# Patient Record
Sex: Male | Born: 1955 | Race: White | Hispanic: No | Marital: Married | Smoking: Never smoker
Health system: Southern US, Community
[De-identification: ages and names within clinical notes are randomized; demographics above are authoritative.]

## PROBLEM LIST (undated history)

## (undated) ENCOUNTER — Emergency Department (HOSPITAL_COMMUNITY)

## (undated) HISTORY — PX: TONSILLECTOMY: SUR1361

---

## 2007-10-17 ENCOUNTER — Emergency Department (HOSPITAL_COMMUNITY): Admission: EM | Admit: 2007-10-17 | Discharge: 2007-10-18 | Payer: Self-pay | Admitting: Emergency Medicine

## 2011-10-09 LAB — COMPREHENSIVE METABOLIC PANEL
ALT: 34
AST: 36
Albumin: 4.1
BUN: 14
CO2: 21
Calcium: 10.3
GFR calc Af Amer: >60
Potassium: 3.6
Sodium: 144
Total Protein: 6.8

## 2011-10-09 LAB — DIFFERENTIAL
Eosinophils Relative: 0
Lymphs Abs: 1.7
Neutro Abs: 18.5 — ABNORMAL HIGH
Neutrophils Relative %: 85 — ABNORMAL HIGH

## 2011-10-09 LAB — URINALYSIS, ROUTINE W REFLEX MICROSCOPIC
Glucose, UA: 100 — AB
Ketones, ur: 40 — AB
Protein, ur: NEGATIVE

## 2011-10-09 LAB — CBC
HCT: 48.6
MCHC: 34.6
MCV: 85
Platelets: 307
RBC: 5.72
WBC: 21.9 — ABNORMAL HIGH

## 2020-07-02 ENCOUNTER — Inpatient Hospital Stay (HOSPITAL_COMMUNITY)
Admission: EM | Admit: 2020-07-02 | Discharge: 2020-07-05 | DRG: 690 | Disposition: A | Discharge status: Home / Self Care | Payer: Medicare Other | Attending: Internal Medicine | Admitting: Internal Medicine

## 2020-07-02 ENCOUNTER — Other Ambulatory Visit: Payer: Self-pay

## 2020-07-02 ENCOUNTER — Encounter (HOSPITAL_COMMUNITY): Payer: Self-pay | Admitting: *Deleted

## 2020-07-02 ENCOUNTER — Emergency Department (HOSPITAL_COMMUNITY): Payer: Medicare Other

## 2020-07-02 DIAGNOSIS — R739 Hyperglycemia, unspecified: Secondary | ICD-10-CM | POA: Diagnosis present

## 2020-07-02 DIAGNOSIS — Z23 Encounter for immunization: Secondary | ICD-10-CM

## 2020-07-02 DIAGNOSIS — N200 Calculus of kidney: Secondary | ICD-10-CM | POA: Diagnosis present

## 2020-07-02 DIAGNOSIS — I160 Hypertensive urgency: Secondary | ICD-10-CM | POA: Diagnosis present

## 2020-07-02 DIAGNOSIS — E663 Overweight: Secondary | ICD-10-CM | POA: Diagnosis present

## 2020-07-02 DIAGNOSIS — Z20822 Contact with and (suspected) exposure to covid-19: Secondary | ICD-10-CM | POA: Diagnosis present

## 2020-07-02 DIAGNOSIS — N151 Renal and perinephric abscess: Secondary | ICD-10-CM | POA: Diagnosis not present

## 2020-07-02 DIAGNOSIS — R197 Diarrhea, unspecified: Secondary | ICD-10-CM | POA: Diagnosis present

## 2020-07-02 DIAGNOSIS — N179 Acute kidney failure, unspecified: Secondary | ICD-10-CM

## 2020-07-02 DIAGNOSIS — R7303 Prediabetes: Secondary | ICD-10-CM | POA: Diagnosis present

## 2020-07-02 DIAGNOSIS — N10 Acute tubulo-interstitial nephritis: Secondary | ICD-10-CM | POA: Diagnosis present

## 2020-07-02 DIAGNOSIS — N289 Disorder of kidney and ureter, unspecified: Secondary | ICD-10-CM

## 2020-07-02 DIAGNOSIS — I10 Essential (primary) hypertension: Secondary | ICD-10-CM | POA: Diagnosis present

## 2020-07-02 DIAGNOSIS — Z6827 Body mass index (BMI) 27.0-27.9, adult: Secondary | ICD-10-CM | POA: Diagnosis not present

## 2020-07-02 DIAGNOSIS — N12 Tubulo-interstitial nephritis, not specified as acute or chronic: Secondary | ICD-10-CM

## 2020-07-02 LAB — URINALYSIS, ROUTINE W REFLEX MICROSCOPIC
Bacteria, UA: NONE SEEN
Bilirubin Urine: NEGATIVE
Glucose, UA: 150 mg/dL — AB
Ketones, ur: 5 mg/dL — AB
Leukocytes,Ua: NEGATIVE
Nitrite: NEGATIVE
Protein, ur: 30 mg/dL — AB
Specific Gravity, Urine: 1.029 (ref 1.005–1.030)
pH: 5 (ref 5.0–8.0)

## 2020-07-02 LAB — COMPREHENSIVE METABOLIC PANEL
ALT: 22 U/L (ref 0–44)
AST: 31 U/L (ref 15–41)
Albumin: 5.1 g/dL — ABNORMAL HIGH (ref 3.5–5.0)
Alkaline Phosphatase: 63 U/L (ref 38–126)
Anion gap: 15 (ref 5–15)
BUN: 21 mg/dL (ref 8–23)
CO2: 20 mmol/L — ABNORMAL LOW (ref 22–32)
Calcium: 10.1 mg/dL (ref 8.9–10.3)
Chloride: 107 mmol/L (ref 98–111)
Creatinine, Ser: 1.68 mg/dL — ABNORMAL HIGH (ref 0.61–1.24)
GFR calc Af Amer: 49 mL/min — ABNORMAL LOW (ref >60)
GFR calc non Af Amer: 42 mL/min — ABNORMAL LOW (ref >60)
Glucose, Bld: 207 mg/dL — ABNORMAL HIGH (ref 70–99)
Potassium: 3.8 mmol/L (ref 3.5–5.1)
Sodium: 142 mmol/L (ref 135–145)
Total Bilirubin: 1.2 mg/dL (ref 0.3–1.2)
Total Protein: 8.2 g/dL — ABNORMAL HIGH (ref 6.5–8.1)

## 2020-07-02 LAB — CBC
HCT: 49.5 % (ref 39.0–52.0)
Hemoglobin: 16.3 g/dL (ref 13.0–17.0)
MCH: 29.6 pg (ref 26.0–34.0)
MCHC: 32.9 g/dL (ref 30.0–36.0)
MCV: 90 fL (ref 80.0–100.0)
Platelets: 241 10*3/uL (ref 150–400)
RBC: 5.5 MIL/uL (ref 4.22–5.81)
RDW: 13.7 % (ref 11.5–15.5)
WBC: 22.2 10*3/uL — ABNORMAL HIGH (ref 4.0–10.5)
nRBC: 0 % (ref 0.0–0.2)

## 2020-07-02 LAB — SARS CORONAVIRUS 2 BY RT PCR (HOSPITAL ORDER, PERFORMED IN ~~LOC~~ HOSPITAL LAB): SARS Coronavirus 2: NEGATIVE

## 2020-07-02 LAB — LIPASE, BLOOD: Lipase: 24 U/L (ref 11–51)

## 2020-07-02 MED ORDER — MELATONIN 5 MG PO TABS
5.0000 mg | ORAL_TABLET | Freq: Every day | ORAL | Status: DC
  Administered 2020-07-02 – 2020-07-04 (×3): 5 mg via ORAL
  Filled 2020-07-02 (×3): qty 1

## 2020-07-02 MED ORDER — HYDROCODONE-ACETAMINOPHEN 5-325 MG PO TABS: 1.0000 | ORAL_TABLET | ORAL | Status: DC | PRN

## 2020-07-02 MED ORDER — SODIUM CHLORIDE 0.9 % IV SOLN: INTRAVENOUS | Status: AC

## 2020-07-02 MED ORDER — ENOXAPARIN SODIUM 40 MG/0.4ML ~~LOC~~ SOLN
40.0000 mg | Freq: Every day | SUBCUTANEOUS | Status: DC
  Administered 2020-07-02 – 2020-07-04 (×3): 40 mg via SUBCUTANEOUS
  Filled 2020-07-02 (×3): qty 0.4

## 2020-07-02 MED ORDER — ONDANSETRON HCL 4 MG/2ML IJ SOLN
4.0000 mg | Freq: Four times a day (QID) | INTRAMUSCULAR | Status: DC | PRN
  Administered 2020-07-03: 4 mg via INTRAVENOUS
  Filled 2020-07-02: qty 2

## 2020-07-02 MED ORDER — ACETAMINOPHEN 650 MG RE SUPP: 650.0000 mg | Freq: Four times a day (QID) | RECTAL | Status: DC | PRN

## 2020-07-02 MED ORDER — FENTANYL CITRATE (PF) 100 MCG/2ML IJ SOLN
50.0000 ug | Freq: Once | INTRAMUSCULAR | Status: AC
  Administered 2020-07-02: 50 ug via INTRAVENOUS
  Filled 2020-07-02: qty 2

## 2020-07-02 MED ORDER — ACETAMINOPHEN 325 MG PO TABS
650.0000 mg | ORAL_TABLET | Freq: Four times a day (QID) | ORAL | Status: DC | PRN
  Administered 2020-07-03: 650 mg via ORAL
  Filled 2020-07-02: qty 2

## 2020-07-02 MED ORDER — METOCLOPRAMIDE HCL 5 MG/ML IJ SOLN
5.0000 mg | Freq: Once | INTRAMUSCULAR | Status: AC
  Administered 2020-07-02: 5 mg via INTRAVENOUS
  Filled 2020-07-02: qty 2

## 2020-07-02 MED ORDER — ONDANSETRON HCL 4 MG PO TABS
4.0000 mg | ORAL_TABLET | Freq: Four times a day (QID) | ORAL | Status: DC | PRN
  Administered 2020-07-02: 4 mg via ORAL
  Filled 2020-07-02: qty 1

## 2020-07-02 MED ORDER — ONDANSETRON 4 MG PO TBDP
4.0000 mg | ORAL_TABLET | Freq: Once | ORAL | Status: AC | PRN
  Administered 2020-07-02: 4 mg via ORAL
  Filled 2020-07-02: qty 1

## 2020-07-02 MED ORDER — BISACODYL 5 MG PO TBEC: 5.0000 mg | DELAYED_RELEASE_TABLET | Freq: Every day | ORAL | Status: DC | PRN

## 2020-07-02 MED ORDER — PIPERACILLIN-TAZOBACTAM 3.375 G IVPB 30 MIN
3.3750 g | Freq: Once | INTRAVENOUS | Status: AC
  Administered 2020-07-02: 3.375 g via INTRAVENOUS
  Filled 2020-07-02: qty 50

## 2020-07-02 MED ORDER — SODIUM CHLORIDE 0.9 % IV BOLUS
1000.0000 mL | Freq: Once | INTRAVENOUS | Status: AC
Start: 2020-07-02 — End: 2020-07-02
  Administered 2020-07-02: 1000 mL via INTRAVENOUS

## 2020-07-02 MED ORDER — INSULIN ASPART 100 UNIT/ML ~~LOC~~ SOLN: 0.0000 [IU] | Freq: Three times a day (TID) | SUBCUTANEOUS | Status: DC

## 2020-07-02 MED ORDER — SODIUM CHLORIDE 0.9% FLUSH
3.0000 mL | Freq: Once | INTRAVENOUS | Status: AC
  Administered 2020-07-02: 3 mL via INTRAVENOUS

## 2020-07-02 MED ORDER — IOHEXOL 300 MG/ML  SOLN
80.0000 mL | Freq: Once | INTRAMUSCULAR | Status: AC | PRN
  Administered 2020-07-02: 75 mL via INTRAVENOUS

## 2020-07-02 MED ORDER — PNEUMOCOCCAL VAC POLYVALENT 25 MCG/0.5ML IJ INJ
0.5000 mL | INJECTION | INTRAMUSCULAR | Status: AC
  Administered 2020-07-03: 0.5 mL via INTRAMUSCULAR
  Filled 2020-07-02: qty 0.5

## 2020-07-02 MED ORDER — HYDROMORPHONE HCL 1 MG/ML IJ SOLN
0.5000 mg | INTRAMUSCULAR | Status: DC | PRN
  Administered 2020-07-03: 1 mg via INTRAVENOUS
  Filled 2020-07-02: qty 1

## 2020-07-02 MED ORDER — SENNOSIDES-DOCUSATE SODIUM 8.6-50 MG PO TABS: 1.0000 | ORAL_TABLET | Freq: Every evening | ORAL | Status: DC | PRN

## 2020-07-02 MED ORDER — SODIUM CHLORIDE (PF) 0.9 % IJ SOLN
INTRAMUSCULAR | Status: AC
  Administered 2020-07-02: 3 mL via INTRAVENOUS
  Filled 2020-07-02: qty 50

## 2020-07-02 NOTE — H&P (Signed)
History and Physical    Eamon Tantillo JQD:[id number] DOB: [birth year] DOA: 07/02/2020  PCP: Patient, No Pcp Per   Patient coming from: Home   Chief Complaint: Abdominal pain, N/V   HPI: Tarvis Blossom is a 64 y.o. male who denies any significant past medical history though admits to rarely seen a physician, now presenting to the emergency department with several hours of abdominal pain, nausea, and nonbloody vomiting.  Patient reports that he woke in his usual state of health and was going about his usual activities this morning when he developed pain in his abdomen and nausea.  Pain was mainly periumbilical with radiation around towards the right flank.  Symptoms were persistent and growing more severe, prompting his presentation to the ED.  He denies any cough or shortness of breath, has not felt fevers or chills, and has not noticed any hematuria.  He has never experienced this previously.  ED Course: Upon arrival to the ED, patient is found to be afebrile, saturating well on room air, and with stable blood pressure.  Chemistry panel is notable for glucose of 202 and creatinine 1.68.  CBC features a leukocytosis to 22,200.  CT of the abdomen and pelvis is concerning for acute left pyelonephritis with 2 cm exophytic enhancing mass most suggestive of abscess.  PA in the ED discussed the case with urology who recommended a trial of antibiotics only.  Patient was given IV fluids, antiemetics, analgesia, and empiric antibiotics.  COVID-19 screening test not yet resulted.  Review of Systems:  All other systems reviewed and apart from HPI, are negative.  History reviewed. No pertinent past medical history.  Past Surgical History:  Procedure Laterality Date  . TONSILLECTOMY       reports that he has never smoked. He has never used smokeless tobacco. He reports that he does not drink alcohol and does not use drugs.  No Known Allergies  History reviewed. No pertinent family history.   Prior to  Admission medications   Medication Sig Start Date End Date Taking? Authorizing Provider  melatonin 5 MG TABS Take 5 mg by mouth at bedtime.   Yes [provider]    Physical Exam: Vitals:   07/02/20 1751 07/02/20 1754 07/02/20 1935 07/02/20 2030  BP: (!) 198/99  130/60 (!) 147/77  Pulse: 65  (!) 59 86  Resp: 19  16 15   Temp: 98.2 F (36.8 C)     TempSrc: Oral     SpO2: 100%  99% 99%  Weight:  86.2 kg    Height:  5\' 10"  (1.778 m)      Constitutional: NAD, calm  Eyes: PERTLA, lids and conjunctivae normal ENMT: Mucous membranes are moist. Posterior pharynx clear of any exudate or lesions.   Neck: normal, supple, no masses, no thyromegaly Respiratory: no wheezing, no crackles. No accessory muscle use.  Cardiovascular: S1 & S2 heard, regular rate and rhythm. No extremity edema.   Abdomen: No distension, soft, mild tenderness without rebound pain or guarding. Bowel sounds active.  Musculoskeletal: no clubbing / cyanosis. No joint deformity upper and lower extremities.   Skin: no significant rashes, lesions, ulcers. Warm, dry, well-perfused. Neurologic: CN 2-12 grossly intact. Sensation intact. Moving all extremities.  Psychiatric: Alert and oriented to person, place, and situation. Very pleasant and cooperative.    Labs and Imaging on Admission: I have personally reviewed following labs and imaging studies  CBC: Recent Labs  Lab 07/02/20 1835  WBC 22.2*  HGB 16.3  HCT 49.5  MCV  90.0  PLT 241   Basic Metabolic Panel: Recent Labs  Lab 07/02/20 1835  NA 142  K 3.8  CL 107  CO2 20*  GLUCOSE 207*  BUN 21  CREATININE 1.68*  CALCIUM 10.1   GFR: Estimated Creatinine Clearance: 45.3 mL/min (A) (by C-G formula based on SCr of 1.68 mg/dL (H)). Liver Function Tests: Recent Labs  Lab 07/02/20 1835  AST 31  ALT 22  ALKPHOS 63  BILITOT 1.2  PROT 8.2*  ALBUMIN 5.1*   Recent Labs  Lab 07/02/20 1835  LIPASE 24   No results for input(s): AMMONIA in the last  168 hours. Coagulation Profile: No results for input(s): INR, PROTIME in the last 168 hours. Cardiac Enzymes: No results for input(s): CKTOTAL, CKMB, CKMBINDEX, TROPONINI in the last 168 hours. BNP (last 3 results) No results for input(s): PROBNP in the last 8760 hours. HbA1C: No results for input(s): HGBA1C in the last 72 hours. CBG: No results for input(s): GLUCAP in the last 168 hours. Lipid Profile: No results for input(s): CHOL, HDL, LDLCALC, TRIG, CHOLHDL, LDLDIRECT in the last 72 hours. Thyroid Function Tests: No results for input(s): TSH, T4TOTAL, FREET4, T3FREE, THYROIDAB in the last 72 hours. Anemia Panel: No results for input(s): VITAMINB12, FOLATE, FERRITIN, TIBC, IRON, RETICCTPCT in the last 72 hours. Urine analysis:    Component Value Date/Time   COLORURINE YELLOW 10/17/2007 2344   APPEARANCEUR CLEAR 10/17/2007 2344   LABSPEC 1.023 10/17/2007 2344   PHURINE 6.0 10/17/2007 2344   GLUCOSEU 100 (A) 10/17/2007 2344   HGBUR NEGATIVE 10/17/2007 2344   BILIRUBINUR NEGATIVE 10/17/2007 2344   KETONESUR 40 (A) 10/17/2007 2344   PROTEINUR NEGATIVE 10/17/2007 2344   UROBILINOGEN 0.2 10/17/2007 2344   NITRITE NEGATIVE 10/17/2007 2344   LEUKOCYTESUR  10/17/2007 2344    NEGATIVE MICROSCOPIC NOT DONE ON URINES WITH NEGATIVE PROTEIN, BLOOD, LEUKOCYTES, NITRITE, OR GLUCOSE <1000 mg/dL.   Sepsis Labs: @LABRCNTIP (procalcitonin:4,lacticidven:4) )No results found for this or any previous visit (from the past 240 hour(s)).   Radiological Exams on Admission: CT ABDOMEN PELVIS W CONTRAST  Result Date: 07/02/2020 CLINICAL DATA:  Abdominal abscess/infection suspected. Umbilical pain with vomiting this morning. EXAM: CT ABDOMEN AND PELVIS WITH CONTRAST TECHNIQUE: Multidetector CT imaging of the abdomen and pelvis was performed using the standard protocol following bolus administration of intravenous contrast. CONTRAST:  86mL OMNIPAQUE IOHEXOL 300 MG/ML  SOLN COMPARISON:  None. FINDINGS:  Lower chest: No acute abnormality. Hepatobiliary: No focal liver abnormality is seen. No radiopaque gallstones, biliary dilatation, or pericholecystic inflammatory changes. Pancreas: Unremarkable. No pancreatic ductal dilatation or surrounding inflammatory changes. Spleen: Normal in size without focal abnormality. Adrenals/Urinary Tract: Adrenal glands are normal in appearance. There is significant stranding surrounding the entire LEFT kidney. There is delay in the LEFT pyelogram on delayed images. No intrarenal or ureteral stones. There is a heterogeneous exophytic enhancing mass extending from the posterior aspect of the midpole region which measures 2.0 centimeters. No evidence for LEFT renal vein thrombosis. There is stranding surrounding the entire LEFT ureter. Within the renal pelvis of the RIGHT kidney, there are 2 calculi, measuring 9 and 5 millimeters. Small cyst is identified in the LOWER pole of the RIGHT kidney. No suspicious renal mass. RIGHT ureter is unremarkable. The bladder and visualized portion of the urethra are normal. Stomach/Bowel: Small sliding-type hiatal hernia. Stomach is otherwise normal in appearance. Small bionics are normal in caliber and wall thickness. Numerous colonic diverticula are present, but there is no evidence for acute diverticulitis. The appendix  is well seen and has a normal appearance. Vascular/Lymphatic: There is atherosclerotic calcification of the abdominal aorta, not associated with aneurysm. No retroperitoneal or mesenteric adenopathy. Reproductive: Prostate is unremarkable. Other: No ascites.  Anterior abdominal wall is unremarkable. Musculoskeletal: There are degenerative changes in the lumbar spine, notably at L3-4 where there is uncovertebral spurring and 4 millimeters retrolisthesis of L3 on L4. Facet hypertrophy noted in the LOWER lumbar levels. No lytic or blastic lesions. IMPRESSION: 1. Acute LEFT pyelonephritis. 2 centimeter heterogeneous exophytic enhancing  mass extending from the posterior aspect of the midpole region of the LEFT kidney likely represents abscess. As renal cell carcinoma cannot be entirely excluded, follow-up is recommended. 2. Nonobstructing RIGHT intrarenal calculi. 3. Normal appendix. 4. Colonic diverticulosis without acute diverticulitis. 5. Small sliding-type hiatal hernia. 6. Degenerative changes in the lumbar spine. 7. Aortic Atherosclerosis (ICD10-I70.0). Electronically Signed   By: Norva Pavlov M.D.   On: 07/02/2020 20:29    Assessment/Plan   1. Acute left pyelonephritis with abscess  - Presents with several hours of abdominal pain and N/V and is found to have marked leukocytosis and CT with stranding about the entire left kidney and ureter with 2 cm enhancing mass that is suspected to reflect an abscess but with RCC not entirely excluded  - ED discussed with urology who recommended trial of antibiotic therapy without drainage  - Check UA, culture blood and urine, treat with empiric cefepime, consult with urology and/or re-image if worsens or fails to improve with antibiotics alone; if he responds well to antibiotics, will still need follow-up given inability to completely excluded RCC    2. Renal insufficiency  - SCr is 1.68 in ED, up from 1.11 in 2008  - Could be acute given his presentation  - Renally-dose medications, continue IVF hydration, repeat chem panel in am    3. Hyperglycemia  - Serum glucose is 202 in ED, was 222 in 2008  - Patient denies hx of DM but has rarely seen a doctor  - Check A1c, check CBGs, use a low-intensity SSI if needed     DVT prophylaxis: Lovenox Code Status: Full  Family Communication: Wife updated at bedside Disposition Plan:  Patient is from: Home  Anticipated d/c is to: Home  Anticipated d/c date is: 07/05/20 Patient currently: has acute pyelonephritis with abscess  Consults called: None  Admission status: Inpatient     Briscoe Deutscher, MD [facility] Pager: See  [email or web address]  If 7AM-7PM, please contact the daytime attending [email or web address]  07/02/2020, 10:22 PM

## 2020-07-02 NOTE — ED Triage Notes (Signed)
Umbilical pain with vomiting since this am, unable to keep anything down.

## 2020-07-02 NOTE — ED Provider Notes (Addendum)
[facility]-EMERGENCY DEPT Provider Note   CSN: [id number] Arrival date & time: 07/02/20  1737     History Chief Complaint  Patient presents with  . Abdominal Pain    Johnny Hawkins is a 64 y.o. male with no significant past medical history.  He presents emergency department with severe abdominal pain.  Patient states that he was in his normal state of health when he awoke this morning and was doing his chores.  He used the bathroom and after words began having pain around his umbilicus.  Since that time he has had progressively worsening and now severe pain that radiates toward the right.  He has had multiple episodes of vomiting and diarrhea.  He denies fever or chills.  Patient does not take any medications regularly for any medical conditions.  He states he also does not see doctors regularly.  He drinks on occasion but does not drink daily, he also denies cigarette use, drug abuse.  HPI     History reviewed. No pertinent past medical history.  There are no problems to display for this patient.   Past Surgical History:  Procedure Laterality Date  . TONSILLECTOMY         No family history on file.  Social History   Tobacco Use  . Smoking status: Never Smoker  . Smokeless tobacco: Never Used  Substance Use Topics  . Alcohol use: Never  . Drug use: Never    Home Medications Prior to Admission medications   Not on File    Allergies    Patient has no known allergies.  Review of Systems   Review of Systems Ten systems reviewed and are negative for acute change, except as noted in the HPI.   Physical Exam Updated Vital Signs BP (!) 198/99 (BP Location: Left Arm)   Pulse 65   Temp 98.2 F (36.8 C) (Oral)   Resp 19   Ht 5\' 10"  (1.778 m)   Wt 86.2 kg   SpO2 100%   BMI 27.26 kg/m   Physical Exam Vitals and nursing note reviewed.  Constitutional:      General: He is not in acute distress.    Appearance: He is well-developed. He is not  diaphoretic.  HENT:     Head: Normocephalic and atraumatic.  Eyes:     General: No scleral icterus.    Conjunctiva/sclera: Conjunctivae normal.  Cardiovascular:     Rate and Rhythm: Normal rate and regular rhythm.     Heart sounds: Normal heart sounds.  Pulmonary:     Effort: Pulmonary effort is normal. No respiratory distress.     Breath sounds: Normal breath sounds.  Abdominal:     General: Bowel sounds are decreased. There is distension.     Palpations: Abdomen is soft.     Tenderness: There is abdominal tenderness.     Hernia: A hernia is present. Hernia is present in the umbilical area.  Musculoskeletal:     Cervical back: Normal range of motion and neck supple.  Skin:    General: Skin is warm and dry.  Neurological:     Mental Status: He is alert.  Psychiatric:        Behavior: Behavior normal.     ED Results / Procedures / Treatments   Labs (all labs ordered are listed, but only abnormal results are displayed) Labs Reviewed  CBC  LIPASE, BLOOD  COMPREHENSIVE METABOLIC PANEL  URINALYSIS, ROUTINE W REFLEX MICROSCOPIC    EKG None  Radiology  No results found.  Procedures Procedures (including critical care time)  Medications Ordered in ED Medications  sodium chloride flush (NS) 0.9 % injection 3 mL (has no administration in time range)  ondansetron (ZOFRAN-ODT) disintegrating tablet 4 mg (4 mg Oral Given 07/02/20 1757)  sodium chloride 0.9 % bolus 1,000 mL (1,000 mLs Intravenous New Bag/Given 07/02/20 1840)  metoCLOPramide (REGLAN) injection 5 mg (5 mg Intravenous Given 07/02/20 1841)  fentaNYL (SUBLIMAZE) injection 50 mcg (50 mcg Intravenous Given 07/02/20 1842)    ED Course  I have reviewed the triage vital signs and the nursing notes.  Pertinent labs & imaging results that were available during my care of the patient were reviewed by me and considered in my medical decision making (see chart for details).  Clinical Course as of Jul 03 1034  Wynelle Link Jul 02, 2020   1916 Glucose(!): 207 [AH]  1916 Creatinine(!): 1.68 [AH]  2046 CT ABDOMEN PELVIS W CONTRAST [AH]    Clinical Course User Index [AH] Arthor Captain, PA-C   MDM Rules/Calculators/A&P                          This patient complains of abdominal pain, this involves an extensive number of treatment options, and is a complaint that carries with it a high risk of complications and morbidity.  The differential diagnosis includes The differential diagnosis for generalized abdominal pain includes, but is not limited to AAA, gastroenteritis, appendicitis, Bowel obstruction, Bowel perforation. Gastroparesis, DKA, Hernia, Inflammatory bowel disease, mesenteric ischemia, pancreatitis, peritonitis SBP, volvulus.   I Ordered, reviewed, and interpreted labs, which included COVID test pending, CMP with hyperglycemia, elevated creatinine without previous to compare, mildly elevated protein levels. LIpase WNL. CBC with marked Leukocytosis. UA pending I ordered medication Fluids; fentanyl, reglan, Zosyn for pain, clinical dehydration and clinically toxic appearance I ordered imaging studies which included CT abd and pelvis and I independently visualized and interpreted imaging which showed Acute left pyelonephritis with ? Renal mass and suspected Left renal abscess. R kidney with non-obstructing large nephrolithiasis within the renal pelvis.  Additional history obtained from wife No previous records available I consulted Dr. Annabell Howells and discussed lab and imaging findings. He states that there are no emergent surgical interventions. He recommends treatment with ABX, fluids and pain meds. Consult if NO improvement or clinically worsening picture.  Critical interventions: Fluids/ ABX/ Pain meds/ Antiemetics  After the interventions stated above, I reevaluated the patient and found markedly improved appearance and demeanor.  Discussed findings and need for hospitalization with the patient and his wife. Answered  questions to t he best of my ability   Final Clinical Impression(s) / ED Diagnoses Final diagnoses:  None    Rx / DC Orders ED Discharge Orders    None       Arthor Captain, PA-C 07/03/20 1044    Arthor Captain, PA-C 07/03/20 1045    Rolan Bucco, MD 07/03/20 1504

## 2020-07-03 ENCOUNTER — Other Ambulatory Visit: Payer: Self-pay

## 2020-07-03 LAB — BASIC METABOLIC PANEL
Anion gap: 10 (ref 5–15)
BUN: 17 mg/dL (ref 8–23)
CO2: 23 mmol/L (ref 22–32)
Calcium: 9.2 mg/dL (ref 8.9–10.3)
Chloride: 107 mmol/L (ref 98–111)
Creatinine, Ser: 1.37 mg/dL — ABNORMAL HIGH (ref 0.61–1.24)
GFR calc Af Amer: >60 mL/min (ref >60)
GFR calc non Af Amer: 54 mL/min — ABNORMAL LOW (ref >60)
Glucose, Bld: 124 mg/dL — ABNORMAL HIGH (ref 70–99)
Potassium: 3.7 mmol/L (ref 3.5–5.1)
Sodium: 140 mmol/L (ref 135–145)

## 2020-07-03 LAB — CBC WITH DIFFERENTIAL/PLATELET
Abs Immature Granulocytes: 0.11 10*3/uL — ABNORMAL HIGH (ref 0.00–0.07)
Basophils Absolute: 0.1 10*3/uL (ref 0.0–0.1)
Basophils Relative: 0 %
Eosinophils Absolute: 0 10*3/uL (ref 0.0–0.5)
Eosinophils Relative: 0 %
HCT: 46.6 % (ref 39.0–52.0)
Hemoglobin: 15.1 g/dL (ref 13.0–17.0)
Immature Granulocytes: 1 %
Lymphocytes Relative: 8 %
Lymphs Abs: 1.7 10*3/uL (ref 0.7–4.0)
MCH: 29.5 pg (ref 26.0–34.0)
MCHC: 32.4 g/dL (ref 30.0–36.0)
MCV: 91 fL (ref 80.0–100.0)
Monocytes Absolute: 2.1 10*3/uL — ABNORMAL HIGH (ref 0.1–1.0)
Monocytes Relative: 10 %
Neutro Abs: 17.9 10*3/uL — ABNORMAL HIGH (ref 1.7–7.7)
Neutrophils Relative %: 81 %
Platelets: 220 10*3/uL (ref 150–400)
RBC: 5.12 MIL/uL (ref 4.22–5.81)
RDW: 14.2 % (ref 11.5–15.5)
WBC: 22 10*3/uL — ABNORMAL HIGH (ref 4.0–10.5)
nRBC: 0 % (ref 0.0–0.2)

## 2020-07-03 LAB — GLUCOSE, CAPILLARY
Glucose-Capillary: 142 mg/dL — ABNORMAL HIGH (ref 70–99)
Glucose-Capillary: 109 mg/dL — ABNORMAL HIGH (ref 70–99)
Glucose-Capillary: 136 mg/dL — ABNORMAL HIGH (ref 70–99)
Glucose-Capillary: 113 mg/dL — ABNORMAL HIGH (ref 70–99)

## 2020-07-03 LAB — HEMOGLOBIN A1C
Hgb A1c MFr Bld: 6.1 % — ABNORMAL HIGH (ref 4.8–5.6)
Mean Plasma Glucose: 128.37 mg/dL

## 2020-07-03 LAB — HIV ANTIBODY (ROUTINE TESTING W REFLEX): HIV Screen 4th Generation wRfx: NONREACTIVE

## 2020-07-03 MED ORDER — SODIUM CHLORIDE 0.9 % IV SOLN
2.0000 g | Freq: Two times a day (BID) | INTRAVENOUS | Status: DC
  Administered 2020-07-03 – 2020-07-05 (×6): 2 g via INTRAVENOUS
  Filled 2020-07-03 (×6): qty 2

## 2020-07-03 MED ORDER — PROMETHAZINE HCL 25 MG/ML IJ SOLN
12.5000 mg | Freq: Once | INTRAMUSCULAR | Status: AC
  Administered 2020-07-03: 12.5 mg via INTRAVENOUS
  Filled 2020-07-03: qty 1

## 2020-07-03 NOTE — Progress Notes (Signed)
Pharmacy Antibiotic Note  Johnny Hawkins is a 64 y.o. male admitted on 07/02/2020 with UTI.  Pharmacy has been consulted for Cefepime dosing.  Plan: Cefepime 2gm iv q12hr  Height: 5\' 10"  (177.8 cm) Weight: 86.2 kg (190 lb) IBW/kg (Calculated) : 73  Temp (24hrs), Avg:98.2 F (36.8 C), Min:98.2 F (36.8 C), Max:98.2 F (36.8 C)  Recent Labs  Lab 07/02/20 1835  WBC 22.2*  CREATININE 1.68*    Estimated Creatinine Clearance: 45.3 mL/min (A) (by C-G formula based on SCr of 1.68 mg/dL (H)).    No Known Allergies  Antimicrobials this admission: Zosyn 07/02/2020 x1 Cefepime 07/02/2020 >>   Dose adjustments this admission: -  Microbiology results: -  Thank you for allowing pharmacy to be a part of this patient's care.  09/02/2020 [place] 07/03/2020 12:36 AM

## 2020-07-03 NOTE — TOC Progression Note (Signed)
Transition of Care [facility]) - Progression Note    Patient Details  Name: Ramere Downs MRN: [id number] Date of Birth: [birth year]  Transition of [facility]) CM/SW Contact  Clearance Coots, LCSW Phone Number: 07/03/2020, 12:28 PM  Clinical Narrative:   Re: No insurance or PCP.   CSW notified admission patient has insurance/Patient provided card.Patient and spouse has access to primary care physician. He has been to [facility] in the past and will make a follow up appointment at discharge.  No other needs identified at this time.      Barriers to Discharge: No Barriers Identified  Expected Discharge Plan and Services                           DME Arranged: N/A DME Agency: NA       HH Arranged: NA HH Agency: NA         Social Determinants of Health (SDOH) Interventions    Readmission Risk Interventions No flowsheet data found.

## 2020-07-03 NOTE — Progress Notes (Signed)
PROGRESS NOTE    Johnny Hawkins  TOI:[id number] DOB: Feb 28, 1956 DOA: 07/02/2020 PCP: Patient, No Pcp Per   Chef Complaints: Abdomen pain nausea vomiting  Brief Narrative: 64 year old male with no previous past medical history but rarely seen by physician seen in the ED with several hours of abdominal pain nausea and nonbloody vomiting since morning 07/02/20. In the ED hypertensive work-up shows significant leukocytosis, creatinine 1.6 glucose 202, CT abdomen pelvis with acute left pyelonephritis with 2 cm exophytic enhancing mass most suggestive of abscess.  ED provider discussed with Dr. Cyndie Chime from urologist who advised trial of antibiotics.  Subjective: Seen this morning.  Wife at the bedside.  Had episode of nausea at 4:00 needing medication but had good rest and slept well since then.No nausea no abdominal pain no flank pain no fever. WBC remains up 22k Creatinine improving at 1.3 from 1.6 Afebrile T-max 98.6.  Blood pressure has been hypertensive 140s to 190s systolic overnight. Denies abdominal pain flank pain fever. Assessment & Plan:  Acute pyelonephritis left-sided with renal abscess 2 cm: Urology discussion consult in the ED, recommend to continue IV antibiotics empirically with cefepime and consult with urology and/or reimage if worsens or fails to improve with antibiotics.  Still with persistent leukocytosis but afebrile, hemodynamically stable with blood pressure on higher side.  Urine culture blood culture pending  Prediabetes, new onset: presented with Hyperglycemia, A1c at 6.1.  Discussed on diet restriction.  Will need PCP follow-up and healthy lifestyle diet modification  Acute renal insufficiency: On admission 1.6 now downtrending to 1.3 his baseline 1.1 in 2008.  Continue on hydration avoid nephrotoxic medication.  Uncontrolled hypertension/ hypertension urgency no prior diagnosis of hypertension but has not been seen by PCCM for some time.  Unclear if he has primary  hypertension versus due to pain.  Will monitor closely and can start antihypertensive regimen if persistently high, caution with rapid drop given abscess.  DVT prophylaxis: enoxaparin (LOVENOX) injection 40 mg Start: 07/02/20 2315 Code Status: Full code Family Communication: plan of care discussed with patient and his wife at bedside.  Status is: Inpatient  Remains inpatient appropriate because:IV treatments appropriate due to intensity of illness or inability to take PO and Inpatient level of care appropriate due to severity of illness   Dispo: The patient is from: Home              Anticipated d/c is to: Home              Anticipated d/c date is: 3 days              Patient currently is not medically stable to d/c. Nutrition: Diet Order            Diet Heart Room service appropriate? Yes; Fluid consistency: Thin  Diet effective now               Body mass index is 27.26 kg/m. Consultants: Dr. Annabell Howells was consulted over the phone.   Procedures:see note Microbiology:see note  Medications: Scheduled Meds: . enoxaparin (LOVENOX) injection  40 mg Subcutaneous QHS  . insulin aspart  0-6 Units Subcutaneous TID WC  . melatonin  5 mg Oral QHS  . pneumococcal 23 valent vaccine  0.5 mL Intramuscular Tomorrow-1000   Continuous Infusions: . ceFEPime (MAXIPIME) IV 2 g (07/03/20 0150)    Antimicrobials: Anti-infectives (From admission, onward)   Start     Dose/Rate Route Frequency Ordered Stop   07/03/20 0045  ceFEPIme (MAXIPIME) 2 g in sodium  chloride 0.9 % 100 mL IVPB     Discontinue     2 g 200 mL/hr over 30 Minutes Intravenous Every 12 hours 07/03/20 0036     07/02/20 1930  piperacillin-tazobactam (ZOSYN) IVPB 3.375 g        3.375 g 100 mL/hr over 30 Minutes Intravenous  Once 07/02/20 1917 07/02/20 2005       Objective: Vitals: Today's Vitals   07/03/20 0145 07/03/20 0545 07/03/20 0617 07/03/20 0846  BP: (!) 152/85 (!) 189/88  139/74  Pulse: 65 65  (!) 51  Resp: 16 17   18   Temp: 98.6 F (37 C) 98 F (36.7 C)  98.2 F (36.8 C)  TempSrc: Oral Oral  Oral  SpO2: 100% 100%  100%  Weight:      Height:      PainSc:   6      Intake/Output Summary (Last 24 hours) at 07/03/2020 1147 Last data filed at 07/03/2020 0859 Gross per 24 hour  Intake 1290 ml  Output 200 ml  Net 1090 ml   Filed Weights   07/02/20 1754  Weight: 86.2 kg   Weight change:    Intake/Output from previous day: 07/04 0701 - 07/05 0700 In: 1050 [IV Piggyback:1050] Out: 200 [Urine:200] Intake/Output this shift: Total I/O In: 240 [P.O.:240] Out: 0   Examination:  General exam: AAOx3 ,NAD, weak appearing. HEENT:Oral mucosa moist, Ear/Nose WNL grossly,dentition normal. Respiratory system: bilaterally clear,no wheezing or crackles,no use of accessory muscle, non tender. Cardiovascular system: S1 & S2 +, regular, No JVD. Gastrointestinal system: Abdomen soft, NT,ND, BS+.  No flank tenderness Nervous System:Alert, awake, moving extremities and grossly nonfocal Extremities: No edema, distal peripheral pulses palpable.  Skin: No rashes,no icterus. MSK: Normal muscle bulk,tone, power  Data Reviewed: I have personally reviewed following labs and imaging studies CBC: Recent Labs  Lab 07/02/20 1835 07/03/20 0551  WBC 22.2* 22.0*  NEUTROABS  --  17.9*  HGB 16.3 15.1  HCT 49.5 46.6  MCV 90.0 91.0  PLT 241 220   Basic Metabolic Panel: Recent Labs  Lab 07/02/20 1835 07/03/20 0551  NA 142 140  K 3.8 3.7  CL 107 107  CO2 20* 23  GLUCOSE 207* 124*  BUN 21 17  CREATININE 1.68* 1.37*  CALCIUM 10.1 9.2   GFR: Estimated Creatinine Clearance: 55.5 mL/min (A) (by C-G formula based on SCr of 1.37 mg/dL (H)). Liver Function Tests: Recent Labs  Lab 07/02/20 1835  AST 31  ALT 22  ALKPHOS 63  BILITOT 1.2  PROT 8.2*  ALBUMIN 5.1*   Recent Labs  Lab 07/02/20 1835  LIPASE 24   No results for input(s): AMMONIA in the last 168 hours. Coagulation Profile: No results for  input(s): INR, PROTIME in the last 168 hours. Cardiac Enzymes: No results for input(s): CKTOTAL, CKMB, CKMBINDEX, TROPONINI in the last 168 hours. BNP (last 3 results) No results for input(s): PROBNP in the last 8760 hours. HbA1C: Recent Labs    07/02/20 2256  HGBA1C 6.1*   CBG: Recent Labs  Lab 07/03/20 0742 07/03/20 1130  GLUCAP 142* 109*   Lipid Profile: No results for input(s): CHOL, HDL, LDLCALC, TRIG, CHOLHDL, LDLDIRECT in the last 72 hours. Thyroid Function Tests: No results for input(s): TSH, T4TOTAL, FREET4, T3FREE, THYROIDAB in the last 72 hours. Anemia Panel: No results for input(s): VITAMINB12, FOLATE, FERRITIN, TIBC, IRON, RETICCTPCT in the last 72 hours. Sepsis Labs: No results for input(s): PROCALCITON, LATICACIDVEN in the last 168 hours.  Recent Results (  from the past 240 hour(s))  SARS Coronavirus 2 by RT PCR (hospital order, performed in [facility] hospital lab) Nasopharyngeal Nasopharyngeal Swab     Status: None   Collection Time: 07/02/20  8:47 PM   Specimen: Nasopharyngeal Swab  Result Value Ref Range Status   SARS Coronavirus 2 NEGATIVE NEGATIVE Final    Comment: (NOTE) SARS-CoV-2 target nucleic acids are NOT DETECTED.  The SARS-CoV-2 RNA is generally detectable in upper and lower respiratory specimens during the acute phase of infection. The lowest concentration of SARS-CoV-2 viral copies this assay can detect is 250 copies / mL. A negative result does not preclude SARS-CoV-2 infection and should not be used as the sole basis for treatment or other patient management decisions.  A negative result may occur with improper specimen collection / handling, submission of specimen other than nasopharyngeal swab, presence of viral mutation(s) within the areas targeted by this assay, and inadequate number of viral copies (<250 copies / mL). A negative result must be combined with clinical observations, patient history, and epidemiological  information.  Fact Sheet for Patients:   BoilerBrush.com.cy  Fact Sheet for Healthcare Providers: https://pope.com/  This test is not yet approved or  cleared by the [place] FDA and has been authorized for detection and/or diagnosis of SARS-CoV-2 by FDA under an Emergency Use Authorization (EUA).  This EUA will remain in effect (meaning this test can be used) for the duration of the COVID-19 declaration under Section 564(b)(1) of the Act, 21 U.S.C. section 360bbb-3(b)(1), unless the authorization is terminated or revoked sooner.  Performed at [facility], [address] [place], [place] [address]   Culture, blood (routine x 2)     Status: None (Preliminary result)   Collection Time: 07/02/20 10:56 PM   Specimen: BLOOD  Result Value Ref Range Status   Specimen Description   Final    BLOOD LEFT ANTECUBITAL Performed at [facility], [address], [place], [place] [address]    Special Requests   Final    BOTTLES DRAWN AEROBIC ONLY Blood Culture results may not be optimal due to an inadequate volume of blood received in culture bottles Performed at [facility], [address], [place], [place] [address]    Culture   Final    NO GROWTH < 12 HOURS Performed at [facility], [address] [place], [place] [address]    Report Status PENDING  Incomplete  Culture, blood (routine x 2)     Status: None (Preliminary result)   Collection Time: 07/02/20 10:56 PM   Specimen: BLOOD RIGHT FOREARM  Result Value Ref Range Status   Specimen Description   Final    BLOOD RIGHT FOREARM Performed at [facility], [address] [place], [place] [address]    Special Requests   Final    BOTTLES DRAWN AEROBIC ONLY Blood Culture adequate volume Performed at [facility], [address] [place], [place] [address]    Culture   Final    NO  GROWTH < 12 HOURS Performed at [facility], [address], [place], [place] [address]    Report Status PENDING  Incomplete      Radiology Studies: CT ABDOMEN PELVIS W CONTRAST  Result Date: 07/02/2020 CLINICAL DATA:  Abdominal abscess/infection suspected. Umbilical pain with vomiting this morning. EXAM: CT ABDOMEN AND PELVIS WITH CONTRAST TECHNIQUE: Multidetector CT imaging of the abdomen and pelvis was performed using the standard protocol  following bolus administration of intravenous contrast. CONTRAST:  75mL OMNIPAQUE IOHEXOL 300 MG/ML  SOLN COMPARISON:  None. FINDINGS: Lower chest: No acute abnormality. Hepatobiliary: No focal liver abnormality is seen. No radiopaque gallstones, biliary dilatation, or pericholecystic inflammatory changes. Pancreas: Unremarkable. No pancreatic ductal dilatation or surrounding inflammatory changes. Spleen: Normal in size without focal abnormality. Adrenals/Urinary Tract: Adrenal glands are normal in appearance. There is significant stranding surrounding the entire LEFT kidney. There is delay in the LEFT pyelogram on delayed images. No intrarenal or ureteral stones. There is a heterogeneous exophytic enhancing mass extending from the posterior aspect of the midpole region which measures 2.0 centimeters. No evidence for LEFT renal vein thrombosis. There is stranding surrounding the entire LEFT ureter. Within the renal pelvis of the RIGHT kidney, there are 2 calculi, measuring 9 and 5 millimeters. Small cyst is identified in the LOWER pole of the RIGHT kidney. No suspicious renal mass. RIGHT ureter is unremarkable. The bladder and visualized portion of the urethra are normal. Stomach/Bowel: Small sliding-type hiatal hernia. Stomach is otherwise normal in appearance. Small bionics are normal in caliber and wall thickness. Numerous colonic diverticula are present, but there is no evidence for acute diverticulitis. The appendix is well seen and has a normal appearance.  Vascular/Lymphatic: There is atherosclerotic calcification of the abdominal aorta, not associated with aneurysm. No retroperitoneal or mesenteric adenopathy. Reproductive: Prostate is unremarkable. Other: No ascites.  Anterior abdominal wall is unremarkable. Musculoskeletal: There are degenerative changes in the lumbar spine, notably at L3-4 where there is uncovertebral spurring and 4 millimeters retrolisthesis of L3 on L4. Facet hypertrophy noted in the LOWER lumbar levels. No lytic or blastic lesions. IMPRESSION: 1. Acute LEFT pyelonephritis. 2 centimeter heterogeneous exophytic enhancing mass extending from the posterior aspect of the midpole region of the LEFT kidney likely represents abscess. As renal cell carcinoma cannot be entirely excluded, follow-up is recommended. 2. Nonobstructing RIGHT intrarenal calculi. 3. Normal appendix. 4. Colonic diverticulosis without acute diverticulitis. 5. Small sliding-type hiatal hernia. 6. Degenerative changes in the lumbar spine. 7. Aortic Atherosclerosis (ICD10-I70.0). Electronically Signed   By: Norva PavlovElizabeth  Brown M.D.   On: 07/02/2020 20:29     LOS: 1 day   Lanae Boastamesh Zylpha Poynor, MD [facility]  07/03/2020, 11:47 AM

## 2020-07-04 LAB — CBC WITH DIFFERENTIAL/PLATELET
Abs Immature Granulocytes: 0.05 10*3/uL (ref 0.00–0.07)
Basophils Absolute: 0.1 10*3/uL (ref 0.0–0.1)
Basophils Relative: 1 %
Eosinophils Absolute: 0.4 10*3/uL (ref 0.0–0.5)
Eosinophils Relative: 3 %
HCT: 45.9 % (ref 39.0–52.0)
Hemoglobin: 14.7 g/dL (ref 13.0–17.0)
Immature Granulocytes: 0 %
Lymphocytes Relative: 24 %
Lymphs Abs: 3.1 10*3/uL (ref 0.7–4.0)
MCH: 29.3 pg (ref 26.0–34.0)
MCHC: 32 g/dL (ref 30.0–36.0)
MCV: 91.6 fL (ref 80.0–100.0)
Monocytes Absolute: 1.5 10*3/uL — ABNORMAL HIGH (ref 0.1–1.0)
Monocytes Relative: 11 %
Neutro Abs: 8.2 10*3/uL — ABNORMAL HIGH (ref 1.7–7.7)
Neutrophils Relative %: 61 %
Platelets: 194 10*3/uL (ref 150–400)
RBC: 5.01 MIL/uL (ref 4.22–5.81)
RDW: 14.1 % (ref 11.5–15.5)
WBC: 13.3 10*3/uL — ABNORMAL HIGH (ref 4.0–10.5)
nRBC: 0 % (ref 0.0–0.2)

## 2020-07-04 LAB — BASIC METABOLIC PANEL
Anion gap: 9 (ref 5–15)
BUN: 15 mg/dL (ref 8–23)
CO2: 24 mmol/L (ref 22–32)
Calcium: 8.8 mg/dL — ABNORMAL LOW (ref 8.9–10.3)
Chloride: 104 mmol/L (ref 98–111)
Creatinine, Ser: 1.12 mg/dL (ref 0.61–1.24)
GFR calc Af Amer: >60 mL/min (ref >60)
GFR calc non Af Amer: >60 mL/min (ref >60)
Glucose, Bld: 118 mg/dL — ABNORMAL HIGH (ref 70–99)
Potassium: 4.1 mmol/L (ref 3.5–5.1)
Sodium: 137 mmol/L (ref 135–145)

## 2020-07-04 LAB — URINE CULTURE: Culture: NO GROWTH

## 2020-07-04 LAB — GLUCOSE, CAPILLARY
Glucose-Capillary: 110 mg/dL — ABNORMAL HIGH (ref 70–99)
Glucose-Capillary: 93 mg/dL (ref 70–99)
Glucose-Capillary: 99 mg/dL (ref 70–99)
Glucose-Capillary: 157 mg/dL — ABNORMAL HIGH (ref 70–99)

## 2020-07-04 NOTE — Progress Notes (Signed)
PROGRESS NOTE    Johnny Hawkins  WUJ:[id number] DOB: 31-Aug-1956 DOA: 07/02/2020 PCP: Patient, No Pcp Per   Chef Complaints: abdomen pain and nausea  Brief Narrative: 64 year old male with no previous past medical history but rarely seen by physician seen in the ED with several hours of abdominal pain nausea and nonbloody vomiting since morning 07/02/20. In the ED hypertensive work-up shows significant leukocytosis, creatinine 1.6 glucose 202, CT abdomen pelvis with acute left pyelonephritis with 2 cm exophytic enhancing mass most suggestive of abscess.  ED provider discussed with Dr. Cyndie Chime from urologist who advised trial of antibiotics.  Subjective: Resting well No nausea no fever and abd pain. Feels much improved today.  Assessment & Plan:  Acute pyelonephritis left-sided with renal abscess 2 NF:[id number] was consulted in the ED, recommend to continue IV antibiotics empirically with cefepime for now.He is improving nicely, wbc improving. Wbc down to 13k.Blood cx NGTD < 24 hr and Urine cx.No growth.   Prediabetes,new onset:Presented with Hyperglycemia,A1c at 6.1.educated on diabetes diet and lifestyle visit follow-up with primary care doctors.    Acute renal insufficiency:On admission 1.6 now resolved to 1.1.  Encourage oral hydration.  Stop IV fluids.    Uncontrolled blood pressure:no prior diagnosis of hypertension but has not been seen by PCCM for some time.  Unclear if he has primary hypertension versus due to pain.  Blood pressure overall improving not needing medication.  Monitor closely will need follow-up with PCP.   DVT prophylaxis: enoxaparin (LOVENOX) injection 40 mg Start: 07/02/20 2315 Code Status: Full code Family Communication: plan of care discussed with patient and his wife at bedside.  Status is: Inpatient  Remains inpatient appropriate because:IV treatments appropriate due to intensity of illness or inability to take PO and Inpatient level of care appropriate due to  severity of illness   Dispo: The patient is from: Home              Anticipated d/c is to: Home              Anticipated d/c date is:1 to 2 days               Patient currently is not medically stable to d/c. Nutrition: Diet Order            Diet Heart Room service appropriate? Yes; Fluid consistency: Thin  Diet effective now               Body mass index is 27.26 kg/m. Consultants: Dr. Annabell Howells was consulted over the phone.   Procedures:see note Microbiology:see note  Medications: Scheduled Meds: . enoxaparin (LOVENOX) injection  40 mg Subcutaneous QHS  . insulin aspart  0-6 Units Subcutaneous TID WC  . melatonin  5 mg Oral QHS   Continuous Infusions: . ceFEPime (MAXIPIME) IV 2 g (07/04/20 1006)    Antimicrobials: Anti-infectives (From admission, onward)   Start     Dose/Rate Route Frequency Ordered Stop   07/03/20 0045  ceFEPIme (MAXIPIME) 2 g in sodium chloride 0.9 % 100 mL IVPB     Discontinue     2 g 200 mL/hr over 30 Minutes Intravenous Every 12 hours 07/03/20 0036     07/02/20 1930  piperacillin-tazobactam (ZOSYN) IVPB 3.375 g        3.375 g 100 mL/hr over 30 Minutes Intravenous  Once 07/02/20 1917 07/02/20 2005       Objective: Vitals: Today's Vitals   07/03/20 2114 07/03/20 2213 07/04/20 0639 07/04/20 0815  BP:   126/75  Pulse:   (!) 57   Resp:   18   Temp:   98.4 F (36.9 C)   TempSrc:   Oral   SpO2:   98%   Weight:      Height:      PainSc: 8  2   0-No pain    Intake/Output Summary (Last 24 hours) at 07/04/2020 1156 Last data filed at 07/04/2020 1000 Gross per 24 hour  Intake 1760 ml  Output 2100 ml  Net -340 ml   Filed Weights   07/02/20 1754  Weight: 86.2 kg   Weight change:    Intake/Output from previous day: 07/05 0701 - 07/06 0700 In: 1570 [P.O.:1370; IV Piggyback:200] Out: 1800 [Urine:1800] Intake/Output this shift: Total I/O In: 580 [P.O.:480; IV Piggyback:100] Out: 300 [Urine:300]  Examination: General exam: AAOx3 , NAD,  weak appearing. HEENT:Oral mucosa moist, Ear/Nose WNL grossly, dentition normal. Respiratory system: bilaterally clear,no wheezing or crackles,no use of accessory muscle Cardiovascular system: S1 & S2 +, No JVD,. Gastrointestinal system: Abdomen soft, NT,ND, BS+ Nervous System:Alert, awake, moving extremities and grossly nonfocal Extremities: No edema, distal peripheral pulses palpable.  Skin: No rashes,no icterus. MSK: Normal muscle bulk,tone, power  Data Reviewed: I have personally reviewed following labs and imaging studies CBC: Recent Labs  Lab 07/02/20 1835 07/03/20 0551 07/04/20 0438  WBC 22.2* 22.0* 13.3*  NEUTROABS  --  17.9* 8.2*  HGB 16.3 15.1 14.7  HCT 49.5 46.6 45.9  MCV 90.0 91.0 91.6  PLT 241 220 194   Basic Metabolic Panel: Recent Labs  Lab 07/02/20 1835 07/03/20 0551 07/04/20 0438  NA 142 140 137  K 3.8 3.7 4.1  CL 107 107 104  CO2 20* 23 24  GLUCOSE 207* 124* 118*  BUN 21 17 15   CREATININE 1.68* 1.37* 1.12  CALCIUM 10.1 9.2 8.8*   GFR: Estimated Creatinine Clearance: 67.9 mL/min (by C-G formula based on SCr of 1.12 mg/dL). Liver Function Tests: Recent Labs  Lab 07/02/20 1835  AST 31  ALT 22  ALKPHOS 63  BILITOT 1.2  PROT 8.2*  ALBUMIN 5.1*   Recent Labs  Lab 07/02/20 1835  LIPASE 24   No results for input(s): AMMONIA in the last 168 hours. Coagulation Profile: No results for input(s): INR, PROTIME in the last 168 hours. Cardiac Enzymes: No results for input(s): CKTOTAL, CKMB, CKMBINDEX, TROPONINI in the last 168 hours. BNP (last 3 results) No results for input(s): PROBNP in the last 8760 hours. HbA1C: Recent Labs    07/02/20 2256  HGBA1C 6.1*   CBG: Recent Labs  Lab 07/03/20 1130 07/03/20 1634 07/03/20 2241 07/04/20 0731 07/04/20 1154  GLUCAP 109* 136* 113* 110* 93   Lipid Profile: No results for input(s): CHOL, HDL, LDLCALC, TRIG, CHOLHDL, LDLDIRECT in the last 72 hours. Thyroid Function Tests: No results for  input(s): TSH, T4TOTAL, FREET4, T3FREE, THYROIDAB in the last 72 hours. Anemia Panel: No results for input(s): VITAMINB12, FOLATE, FERRITIN, TIBC, IRON, RETICCTPCT in the last 72 hours. Sepsis Labs: No results for input(s): PROCALCITON, LATICACIDVEN in the last 168 hours.  Recent Results (from the past 240 hour(s))  SARS Coronavirus 2 by RT PCR (hospital order, performed in [facility] hospital lab) Nasopharyngeal Nasopharyngeal Swab     Status: None   Collection Time: 07/02/20  8:47 PM   Specimen: Nasopharyngeal Swab  Result Value Ref Range Status   SARS Coronavirus 2 NEGATIVE NEGATIVE Final    Comment: (NOTE) SARS-CoV-2 target nucleic acids are NOT DETECTED.  The SARS-CoV-2 RNA  is generally detectable in upper and lower respiratory specimens during the acute phase of infection. The lowest concentration of SARS-CoV-2 viral copies this assay can detect is 250 copies / mL. A negative result does not preclude SARS-CoV-2 infection and should not be used as the sole basis for treatment or other patient management decisions.  A negative result may occur with improper specimen collection / handling, submission of specimen other than nasopharyngeal swab, presence of viral mutation(s) within the areas targeted by this assay, and inadequate number of viral copies (<250 copies / mL). A negative result must be combined with clinical observations, patient history, and epidemiological information.  Fact Sheet for Patients:   BoilerBrush.com.cyhttps://www.fda.gov/media/136312/download  Fact Sheet for Healthcare Providers: https://pope.com/https://www.fda.gov/media/136313/download  This test is not yet approved or  cleared by the [place] FDA and has been authorized for detection and/or diagnosis of SARS-CoV-2 by FDA under an Emergency Use Authorization (EUA).  This EUA will remain in effect (meaning this test can be used) for the duration of the COVID-19 declaration under Section 564(b)(1) of the Act, 21 U.S.C. section  360bbb-3(b)(1), unless the authorization is terminated or revoked sooner.  Performed at [facility], [address] [place], [place] [address]   Culture, Urine     Status: None   Collection Time: 07/02/20  9:22 PM   Specimen: Urine, Random  Result Value Ref Range Status   Specimen Description   Final    URINE, RANDOM Performed at [facility], [address] [place], [place] [address]    Special Requests   Final    NONE Performed at [facility], [address] [place], [place] [address]    Culture   Final    NO GROWTH Performed at [facility], [address] [place], [place] [address]    Report Status 07/04/2020 FINAL  Final  Culture, blood (routine x 2)     Status: None (Preliminary result)   Collection Time: 07/02/20 10:56 PM   Specimen: BLOOD  Result Value Ref Range Status   Specimen Description   Final    BLOOD LEFT ANTECUBITAL Performed at [facility], [address] [place], [place] [address]    Special Requests   Final    BOTTLES DRAWN AEROBIC ONLY Blood Culture results may not be optimal due to an inadequate volume of blood received in culture bottles Performed at [facility], [address] [place], [place] [address]    Culture   Final    NO GROWTH < 24 HOURS Performed at [facility], [address] [place], [place] [address]    Report Status PENDING  Incomplete  Culture, blood (routine x 2)     Status: None (Preliminary result)   Collection Time: 07/02/20 10:56 PM   Specimen: BLOOD RIGHT FOREARM  Result Value Ref Range Status   Specimen Description   Final    BLOOD RIGHT FOREARM Performed at [facility], [address] [place], [place] [address]    Special Requests   Final    BOTTLES DRAWN AEROBIC ONLY Blood Culture adequate volume Performed at [facility], [address] [place], [place]  [address]    Culture   Final    NO GROWTH < 24 HOURS Performed at [facility], [address] [place], [place] [address]    Report Status PENDING  Incomplete      Radiology Studies: CT ABDOMEN PELVIS  W CONTRAST  Result Date: 07/02/2020 CLINICAL DATA:  Abdominal abscess/infection suspected. Umbilical pain with vomiting this morning. EXAM: CT ABDOMEN AND PELVIS WITH CONTRAST TECHNIQUE: Multidetector CT imaging of the abdomen and pelvis was performed using the standard protocol following bolus administration of intravenous contrast. CONTRAST:  50mL OMNIPAQUE IOHEXOL 300 MG/ML  SOLN COMPARISON:  None. FINDINGS: Lower chest: No acute abnormality. Hepatobiliary: No focal liver abnormality is seen. No radiopaque gallstones, biliary dilatation, or pericholecystic inflammatory changes. Pancreas: Unremarkable. No pancreatic ductal dilatation or surrounding inflammatory changes. Spleen: Normal in size without focal abnormality. Adrenals/Urinary Tract: Adrenal glands are normal in appearance. There is significant stranding surrounding the entire LEFT kidney. There is delay in the LEFT pyelogram on delayed images. No intrarenal or ureteral stones. There is a heterogeneous exophytic enhancing mass extending from the posterior aspect of the midpole region which measures 2.0 centimeters. No evidence for LEFT renal vein thrombosis. There is stranding surrounding the entire LEFT ureter. Within the renal pelvis of the RIGHT kidney, there are 2 calculi, measuring 9 and 5 millimeters. Small cyst is identified in the LOWER pole of the RIGHT kidney. No suspicious renal mass. RIGHT ureter is unremarkable. The bladder and visualized portion of the urethra are normal. Stomach/Bowel: Small sliding-type hiatal hernia. Stomach is otherwise normal in appearance. Small bionics are normal in caliber and wall thickness. Numerous colonic diverticula are present, but there is no evidence for acute diverticulitis. The appendix is well  seen and has a normal appearance. Vascular/Lymphatic: There is atherosclerotic calcification of the abdominal aorta, not associated with aneurysm. No retroperitoneal or mesenteric adenopathy. Reproductive: Prostate is unremarkable. Other: No ascites.  Anterior abdominal wall is unremarkable. Musculoskeletal: There are degenerative changes in the lumbar spine, notably at L3-4 where there is uncovertebral spurring and 4 millimeters retrolisthesis of L3 on L4. Facet hypertrophy noted in the LOWER lumbar levels. No lytic or blastic lesions. IMPRESSION: 1. Acute LEFT pyelonephritis. 2 centimeter heterogeneous exophytic enhancing mass extending from the posterior aspect of the midpole region of the LEFT kidney likely represents abscess. As renal cell carcinoma cannot be entirely excluded, follow-up is recommended. 2. Nonobstructing RIGHT intrarenal calculi. 3. Normal appendix. 4. Colonic diverticulosis without acute diverticulitis. 5. Small sliding-type hiatal hernia. 6. Degenerative changes in the lumbar spine. 7. Aortic Atherosclerosis (ICD10-I70.0). Electronically Signed   By: Norva Pavlov M.D.   On: 07/02/2020 20:29     LOS: 2 days   Lanae Boast, MD [facility]  07/04/2020, 11:56 AM

## 2020-07-05 LAB — CBC WITH DIFFERENTIAL/PLATELET
Abs Immature Granulocytes: 0.05 10*3/uL (ref 0.00–0.07)
Basophils Absolute: 0.1 10*3/uL (ref 0.0–0.1)
Basophils Relative: 1 %
Eosinophils Absolute: 0.5 10*3/uL (ref 0.0–0.5)
Eosinophils Relative: 4 %
HCT: 49.5 % (ref 39.0–52.0)
Hemoglobin: 16.3 g/dL (ref 13.0–17.0)
Immature Granulocytes: 0 %
Lymphocytes Relative: 23 %
Lymphs Abs: 2.7 10*3/uL (ref 0.7–4.0)
MCH: 29.5 pg (ref 26.0–34.0)
MCHC: 32.9 g/dL (ref 30.0–36.0)
MCV: 89.7 fL (ref 80.0–100.0)
Monocytes Absolute: 1.4 10*3/uL — ABNORMAL HIGH (ref 0.1–1.0)
Monocytes Relative: 11 %
Neutro Abs: 7.2 10*3/uL (ref 1.7–7.7)
Neutrophils Relative %: 61 %
Platelets: 211 10*3/uL (ref 150–400)
RBC: 5.52 MIL/uL (ref 4.22–5.81)
RDW: 13.7 % (ref 11.5–15.5)
WBC: 12 10*3/uL — ABNORMAL HIGH (ref 4.0–10.5)
nRBC: 0 % (ref 0.0–0.2)

## 2020-07-05 LAB — GLUCOSE, CAPILLARY
Glucose-Capillary: 102 mg/dL — ABNORMAL HIGH (ref 70–99)
Glucose-Capillary: 99 mg/dL (ref 70–99)

## 2020-07-05 MED ORDER — CEPHALEXIN 500 MG PO CAPS: 500.0000 mg | ORAL_CAPSULE | Freq: Four times a day (QID) | ORAL | 0 refills | Status: AC

## 2020-07-05 MED ORDER — SENNOSIDES-DOCUSATE SODIUM 8.6-50 MG PO TABS: 1.0000 | ORAL_TABLET | Freq: Every evening | ORAL | 0 refills | Status: AC | PRN

## 2020-07-05 NOTE — Care Management Important Message (Signed)
Important Message  Patient Details IM Letter given to [name] SW Case Manager to present to the Patient Name: Johnny Hawkins MRN: [id number] Date of Birth: 10-16-1956   Medicare Important Message Given:  Yes     Caren Macadam 07/05/2020, 12:07 PM

## 2020-07-05 NOTE — Progress Notes (Signed)
  RD consulted for nutrition education regarding pre- diabetes. Pt also reports he is having some elevated blood pressure.  Lab Results  Component Value Date   HGBA1C 6.1 (H) 07/02/2020   Spoke with pt and pt's wife at bedside. Pt reports his appetite has improved and he is eating normally now. Pt reports consuming a lot of soda PTA. Encouraged pt to consume diet soda with meals and to find another unsweetened beverage as his hydration source. Encouraged daily exercise as well.  RD provided "Carbohydrate Counting for People with Diabetes" and "Sodium-free flavoring" handout from the Academy of Nutrition and Dietetics. Discussed different food groups and their effects on blood sugar, emphasizing carbohydrate-containing foods. Provided list of carbohydrates and recommended serving sizes of common foods.  Discussed importance of controlled and consistent carbohydrate intake throughout the day. Provided examples of ways to balance meals/snacks and encouraged intake of high-fiber, whole grain complex carbohydrates. Teach back method used.  Expect good compliance. Pt and wife both with great questions.  Body mass index is 27.26 kg/m. Pt meets criteria for overweight based on current BMI.  Current diet order is heart healthy, patient is consuming approximately 60% of meals at this time. Labs and medications reviewed. No further nutrition interventions warranted at this time.  If additional nutrition issues arise, please re-consult RD.  Tilda Franco, MS, RD, LDN Inpatient Clinical Dietitian Contact information available via Amion

## 2020-07-05 NOTE — Discharge Summary (Signed)
Physician Discharge Summary  Johnny Hawkins RUE:[id number] DOB: Jul 26, 1956 DOA: 07/02/2020  PCP: Patient, No Pcp Per  Admit date: 07/02/2020 Discharge date: 07/05/2020  Admitted From: Home Disposition: Home  Recommendations for Outpatient Follow-up:  1. Follow up with PCP in 1-2 weeks 2. Please obtain BMP/CBC in one week 3. Please follow up on the following pending results:  Home Health:no  Equipment/Devices: none  Discharge Condition: Stable Code Status: full Diet recommendation:  Diet Order            Diet - low sodium heart healthy           Diet Heart Room service appropriate? Yes; Fluid consistency: Thin  Diet effective now                  Brief/Interim Summary: 64 year old male with no previous past medical history but rarely seen by physician seen in the ED with several hours of abdominal pain nausea and nonbloody vomiting since morning 07/02/20. In the ED hypertensive work-up shows significant leukocytosis, creatinine 1.6 glucose 202, CT abdomen pelvis with acute left pyelonephritis with 2 cm exophytic enhancing mass most suggestive of abscess.  ED provider discussed with Dr. Annabell Howells from urologist who advised trial of antibiotics. Patient was treated with IV antibiotics his blood and urine culture were negative.  He has significantly improved tolerating regular diet no nausea vomiting fever flank pain abdominal pain.  Leukocytosis improved to 12 K.  Has been ambulating feels well and okay for discharge home.  I discussed with infectious disease Dr. Ilsa Iha recommends Keflex discussed Dr. Retta Diones recommends 2 weeks of antibiotics okay to discharge home and he will [facility] with [facility].  Will discharge on Keflex for 12 more days, patient is to follow-up with [facility] as he also has a stone and he will need repeat imaging to rule out tumor.  This was discussed with patient in detail and he has verbalized understanding.    Discharge Diagnoses:  Principal  Problem:   Renal abscess, left/acute pyelonephritis left.  Clinically improved.  Will discharge on oral antibiotics as above.  Will need repeat imaging to rule out renal cell carcinoma. Renal stone will need follow-up with urology patient is aware. Active Problems: Hyperglycemia with prediabetes, education and dietary counseling and will need PCP follow-up. Acute kidney injury resolved.  Uncontrolled blood pressure on admission no history of hypertension follow-up with PCP to monitor blood pressure.  Blood pressure stable here.  Overweight based on BMI of 27.  Follow-up with PCP for weight loss. Consults: Infectious disease over the phone urology over the phone  Subjective: Resting well well no nausea no vomiting no flank pain.  No fever.  Tolerating regular diet. Discharge Exam: Vitals:   07/04/20 2125 07/05/20 0541  BP: (!) 148/75 (!) 144/81  Pulse: (!) 59 (!) 55  Resp: 18 18  Temp: 99.2 F (37.3 C) 98.1 F (36.7 C)  SpO2: 98% 95%   General: Pt is alert, awake, not in acute distress Cardiovascular: RRR, S1/S2 +, no rubs, no gallops Respiratory: CTA bilaterally, no wheezing, no rhonchi Abdominal: Soft, NT, ND, bowel sounds + no flank tenderness on thumping the bilateral flank Extremities: no edema, no cyanosis  Discharge Instructions  Discharge Instructions    Diet - low sodium heart healthy   Complete by: As directed    Discharge instructions   Complete by: As directed    Please call call MD or return to ER for similar or worsening recurring problem that brought you to  hospital or if any fever,nausea/vomiting,abdominal pain, uncontrolled pain, chest pain,  shortness of breath or any other alarming symptoms. YOU WILL NEED TO follow-up with urology regarding the 2 cm lesion in your left kidney - to make sure it is not tumor.   Please follow-up your doctor as instructed in a week time and call the office for appointment.  Please avoid alcohol, smoking, or any other illicit  substance and maintain healthy habits including taking your regular medications as prescribed.  You were cared for by a hospitalist during your hospital stay. If you have any questions about your discharge medications or the care you received while you were in the hospital after you are discharged, you can call the unit and ask to speak with the hospitalist on call if the hospitalist that took care of you is not available.  Once you are discharged, your primary care physician will handle any further medical issues. Please note that NO REFILLS for any discharge medications will be authorized once you are discharged, as it is imperative that you return to your primary care physician (or establish a relationship with a primary care physician if you do not have one) for your aftercare needs so that they can reassess your need for medications and monitor your lab values   Increase activity slowly   Complete by: As directed      Allergies as of 07/05/2020   No Known Allergies     Medication List    TAKE these medications   cephALEXin 500 MG capsule Commonly known as: KEFLEX Take 1 capsule (500 mg total) by mouth 4 (four) times daily for 12 days.   melatonin 5 MG Tabs Take 5 mg by mouth at bedtime.   senna-docusate 8.6-50 MG tablet Commonly known as: Senokot-S Take 1 tablet by mouth at bedtime as needed for up to 14 days for mild constipation.       Follow-up Information    [facility]. Call.   Why: call for provider for next appointment. Contact information: [address] [place] [address] [phone]       [facility] Follow up.   Why: If not able to find a primary care doctor please call [facility] Contact information: [address] [place] [phone]             No Known Allergies  The results of significant diagnostics from this  hospitalization (including imaging, microbiology, ancillary and laboratory) are listed below for reference.    Microbiology: Recent Results (from the past 240 hour(s))  SARS Coronavirus 2 by RT PCR (hospital order, performed in [facility] hospital lab) Nasopharyngeal Nasopharyngeal Swab     Status: None   Collection Time: 07/02/20  8:47 PM   Specimen: Nasopharyngeal Swab  Result Value Ref Range Status   SARS Coronavirus 2 NEGATIVE NEGATIVE Final    Comment: (NOTE) SARS-CoV-2 target nucleic acids are NOT DETECTED.  The SARS-CoV-2 RNA is generally detectable in upper and lower respiratory specimens during the acute phase of infection. The lowest concentration of SARS-CoV-2 viral copies this assay can detect is 250 copies / mL. A negative result does not preclude SARS-CoV-2 infection and should not be used as the sole basis for treatment or other patient management decisions.  A negative result may occur with improper specimen collection / handling, submission of specimen other than nasopharyngeal swab, presence of viral mutation(s) within the areas targeted  by this assay, and inadequate number of viral copies (<250 copies / mL). A negative result must be combined with clinical observations, patient history, and epidemiological information.  Fact Sheet for Patients:   BoilerBrush.com.cyhttps://www.fda.gov/media/136312/download  Fact Sheet for Healthcare Providers: https://pope.com/https://www.fda.gov/media/136313/download  This test is not yet approved or  cleared by the [place] FDA and has been authorized for detection and/or diagnosis of SARS-CoV-2 by FDA under an Emergency Use Authorization (EUA).  This EUA will remain in effect (meaning this test can be used) for the duration of the COVID-19 declaration under Section 564(b)(1) of the Act, 21 U.S.C. section 360bbb-3(b)(1), unless the authorization is terminated or revoked sooner.  Performed at [facility], [address] [place], [place] [address]   Culture, Urine     Status: None   Collection Time: 07/02/20  9:22 PM   Specimen: Urine, Random  Result Value Ref Range Status   Specimen Description   Final    URINE, RANDOM Performed at [facility], [address] [place], [place] [address]    Special Requests   Final    NONE Performed at [facility], [address] [place], [place] [address]    Culture   Final    NO GROWTH Performed at [facility], [address] [place], [place] [address]    Report Status 07/04/2020 FINAL  Final  Culture, blood (routine x 2)     Status: None (Preliminary result)   Collection Time: 07/02/20 10:56 PM   Specimen: BLOOD  Result Value Ref Range Status   Specimen Description   Final    BLOOD LEFT ANTECUBITAL Performed at [facility], [address] [place], [place] [address]    Special Requests   Final    BOTTLES DRAWN AEROBIC ONLY Blood Culture results may not be optimal due to an inadequate volume of blood received in culture bottles Performed at [facility], [address] [place], [place] [address]    Culture   Final    NO GROWTH 2 DAYS Performed at [facility], [address] [place], [place] [address]    Report Status PENDING  Incomplete  Culture, blood (routine x 2)     Status: None (Preliminary result)   Collection Time: 07/02/20 10:56 PM   Specimen: BLOOD RIGHT FOREARM  Result Value Ref Range Status   Specimen Description   Final    BLOOD RIGHT FOREARM Performed at [facility], [address] [place], [place] [address]    Special Requests   Final    BOTTLES DRAWN AEROBIC ONLY Blood Culture adequate volume Performed at [facility], [address] [place], [place] [address]    Culture   Final    NO GROWTH 2 DAYS Performed at [facility], [address] [place], [place] [address]    Report Status  PENDING  Incomplete    Procedures/Studies: CT ABDOMEN PELVIS W CONTRAST  Result Date: 07/02/2020 CLINICAL DATA:  Abdominal abscess/infection suspected. Umbilical pain with vomiting this morning. EXAM: CT ABDOMEN AND PELVIS WITH CONTRAST TECHNIQUE: Multidetector CT imaging of the abdomen and pelvis was performed using the standard protocol following bolus administration of intravenous contrast. CONTRAST:  75mL OMNIPAQUE IOHEXOL 300 MG/ML  SOLN COMPARISON:  None. FINDINGS: Lower chest: No acute abnormality. Hepatobiliary: No focal liver abnormality is seen. No radiopaque gallstones, biliary dilatation, or pericholecystic inflammatory changes. Pancreas: Unremarkable. No pancreatic ductal dilatation or  surrounding inflammatory changes. Spleen: Normal in size without focal abnormality. Adrenals/Urinary Tract: Adrenal glands are normal in appearance. There is significant stranding surrounding the entire LEFT kidney. There is delay in the LEFT pyelogram on delayed images. No intrarenal or ureteral stones. There is a heterogeneous exophytic enhancing mass extending from the posterior aspect of the midpole region which measures 2.0 centimeters. No evidence for LEFT renal vein thrombosis. There is stranding surrounding the entire LEFT ureter. Within the renal pelvis of the RIGHT kidney, there are 2 calculi, measuring 9 and 5 millimeters. Small cyst is identified in the LOWER pole of the RIGHT kidney. No suspicious renal mass. RIGHT ureter is unremarkable. The bladder and visualized portion of the urethra are normal. Stomach/Bowel: Small sliding-type hiatal hernia. Stomach is otherwise normal in appearance. Small bionics are normal in caliber and wall thickness. Numerous colonic diverticula are present, but there is no evidence for acute diverticulitis. The appendix is well seen and has a normal appearance. Vascular/Lymphatic: There is atherosclerotic calcification of the abdominal aorta, not associated with aneurysm. No  retroperitoneal or mesenteric adenopathy. Reproductive: Prostate is unremarkable. Other: No ascites.  Anterior abdominal wall is unremarkable. Musculoskeletal: There are degenerative changes in the lumbar spine, notably at L3-4 where there is uncovertebral spurring and 4 millimeters retrolisthesis of L3 on L4. Facet hypertrophy noted in the LOWER lumbar levels. No lytic or blastic lesions. IMPRESSION: 1. Acute LEFT pyelonephritis. 2 centimeter heterogeneous exophytic enhancing mass extending from the posterior aspect of the midpole region of the LEFT kidney likely represents abscess. As renal cell carcinoma cannot be entirely excluded, follow-up is recommended. 2. Nonobstructing RIGHT intrarenal calculi. 3. Normal appendix. 4. Colonic diverticulosis without acute diverticulitis. 5. Small sliding-type hiatal hernia. 6. Degenerative changes in the lumbar spine. 7. Aortic Atherosclerosis (ICD10-I70.0). Electronically Signed   By: Norva Pavlov M.D.   On: 07/02/2020 20:29    Labs: BNP (last 3 results) No results for input(s): BNP in the last 8760 hours. Basic Metabolic Panel: Recent Labs  Lab 07/02/20 1835 07/03/20 0551 07/04/20 0438  NA 142 140 137  K 3.8 3.7 4.1  CL 107 107 104  CO2 20* 23 24  GLUCOSE 207* 124* 118*  BUN CREATININE 1.68* 1.37* 1.12  CALCIUM 10.1 9.2 8.8*   Liver Function Tests: Recent Labs  Lab 07/02/20 1835  AST 31  ALT 22  ALKPHOS 63  BILITOT 1.2  PROT 8.2*  ALBUMIN 5.1*   Recent Labs  Lab 07/02/20 1835  LIPASE 24   No results for input(s): AMMONIA in the last 168 hours. CBC: Recent Labs  Lab 07/02/20 1835 07/03/20 0551 07/04/20 0438 07/05/20 0451  WBC 22.2* 22.0* 13.3* 12.0*  NEUTROABS  --  17.9* 8.2* 7.2  HGB 16.3 15.1 14.7 16.3  HCT 49.5 46.6 45.9 49.5  MCV 90.0 91.0 91.6 89.7  PLT 241 220 194 211   Cardiac Enzymes: No results for input(s): CKTOTAL, CKMB, CKMBINDEX, TROPONINI in the last 168 hours. BNP: Invalid input(s):  POCBNP CBG: Recent Labs  Lab 07/04/20 1154 07/04/20 1643 07/04/20 2128 07/05/20 0717 07/05/20 1208  GLUCAP 93 99 157* 102* 99   D-Dimer No results for input(s): DDIMER in the last 72 hours. Hgb A1c Recent Labs    07/02/20 2256  HGBA1C 6.1*   Lipid Profile No results for input(s): CHOL, HDL, LDLCALC, TRIG, CHOLHDL, LDLDIRECT in the last 72 hours. Thyroid function studies No results for input(s): TSH, T4TOTAL, T3FREE, THYROIDAB in the last 72 hours.  Invalid input(s):  FREET3 Anemia work up No results for input(s): VITAMINB12, FOLATE, FERRITIN, TIBC, IRON, RETICCTPCT in the last 72 hours. Urinalysis    Component Value Date/Time   COLORURINE YELLOW 07/02/2020 1755   APPEARANCEUR CLEAR 07/02/2020 1755   LABSPEC 1.029 07/02/2020 1755   PHURINE 5.0 07/02/2020 1755   GLUCOSEU 150 (A) 07/02/2020 1755   HGBUR MODERATE (A) 07/02/2020 1755   BILIRUBINUR NEGATIVE 07/02/2020 1755   KETONESUR 5 (A) 07/02/2020 1755   PROTEINUR 30 (A) 07/02/2020 1755   UROBILINOGEN 0.2 10/17/2007 2344   NITRITE NEGATIVE 07/02/2020 1755   LEUKOCYTESUR NEGATIVE 07/02/2020 1755   Sepsis Labs Invalid input(s): PROCALCITONIN,  WBC,  LACTICIDVEN Microbiology Recent Results (from the past 240 hour(s))  SARS Coronavirus 2 by RT PCR (hospital order, performed in [facility] hospital lab) Nasopharyngeal Nasopharyngeal Swab     Status: None   Collection Time: 07/02/20  8:47 PM   Specimen: Nasopharyngeal Swab  Result Value Ref Range Status   SARS Coronavirus 2 NEGATIVE NEGATIVE Final    Comment: (NOTE) SARS-CoV-2 target nucleic acids are NOT DETECTED.  The SARS-CoV-2 RNA is generally detectable in upper and lower respiratory specimens during the acute phase of infection. The lowest concentration of SARS-CoV-2 viral copies this assay can detect is 250 copies / mL. A negative result does not preclude SARS-CoV-2 infection and should not be used as the sole basis for treatment or other patient management  decisions.  A negative result may occur with improper specimen collection / handling, submission of specimen other than nasopharyngeal swab, presence of viral mutation(s) within the areas targeted by this assay, and inadequate number of viral copies (<250 copies / mL). A negative result must be combined with clinical observations, patient history, and epidemiological information.  Fact Sheet for Patients:   BoilerBrush.com.cy  Fact Sheet for Healthcare Providers: https://pope.com/  This test is not yet approved or  cleared by the [place] FDA and has been authorized for detection and/or diagnosis of SARS-CoV-2 by FDA under an Emergency Use Authorization (EUA).  This EUA will remain in effect (meaning this test can be used) for the duration of the COVID-19 declaration under Section 564(b)(1) of the Act, 21 U.S.C. section 360bbb-3(b)(1), unless the authorization is terminated or revoked sooner.  Performed at [facility], [address] [place], [place] [address]   Culture, Urine     Status: None   Collection Time: 07/02/20  9:22 PM   Specimen: Urine, Random  Result Value Ref Range Status   Specimen Description   Final    URINE, RANDOM Performed at [facility], [address], [place], [place] [address]    Special Requests   Final    NONE Performed at [facility], [address] [place], [place] [address]    Culture   Final    NO GROWTH Performed at [facility], [address] [place], [place] [address]    Report Status 07/04/2020 FINAL  Final  Culture, blood (routine x 2)     Status: None (Preliminary result)   Collection Time: 07/02/20 10:56 PM   Specimen: BLOOD  Result Value Ref Range Status   Specimen Description   Final    BLOOD LEFT ANTECUBITAL Performed at [facility], [address], [place], [place] [address]    Special  Requests   Final    BOTTLES DRAWN AEROBIC ONLY Blood Culture results may not be optimal due to an inadequate volume of blood received in  culture bottles Performed at [facility], [address], [place], [place] [address]    Culture   Final    NO GROWTH 2 DAYS Performed at [facility], [address], [place], [place] [address]    Report Status PENDING  Incomplete  Culture, blood (routine x 2)     Status: None (Preliminary result)   Collection Time: 07/02/20 10:56 PM   Specimen: BLOOD RIGHT FOREARM  Result Value Ref Range Status   Specimen Description   Final    BLOOD RIGHT FOREARM Performed at [facility], [address] [place], [place] [address]    Special Requests   Final    BOTTLES DRAWN AEROBIC ONLY Blood Culture adequate volume Performed at [facility], [address] [place], [place] [address]    Culture   Final    NO GROWTH 2 DAYS Performed at [facility], [address] [place], [place] [address]    Report Status PENDING  Incomplete     Time coordinating discharge: 25  minutes  SIGNED: Lanae Boast, MD  [facility] 07/05/2020, 1:36 PM  If 7PM-7AM, please contact night-coverage [email or web address]

## 2020-07-08 LAB — CULTURE, BLOOD (ROUTINE X 2)
Culture: NO GROWTH
Culture: NO GROWTH
Special Requests: ADEQUATE

## 2021-11-07 DIAGNOSIS — Z800 Family history of malignant neoplasm of digestive organs: Secondary | ICD-10-CM | POA: Diagnosis not present

## 2021-11-07 DIAGNOSIS — Z01818 Encounter for other preprocedural examination: Secondary | ICD-10-CM | POA: Diagnosis not present

## 2021-11-07 DIAGNOSIS — Z86010 Personal history of colonic polyps: Secondary | ICD-10-CM | POA: Diagnosis not present

## 2021-12-07 DIAGNOSIS — K635 Polyp of colon: Secondary | ICD-10-CM | POA: Diagnosis not present

## 2021-12-07 DIAGNOSIS — Z1211 Encounter for screening for malignant neoplasm of colon: Secondary | ICD-10-CM | POA: Diagnosis not present

## 2021-12-14 DIAGNOSIS — K635 Polyp of colon: Secondary | ICD-10-CM | POA: Diagnosis not present

## 2022-01-11 IMAGING — CT CT ABD-PELV W/ CM
2 of 5 series · 15 of 46 positions shown, 17 images · IV contrast (omnipaque)
Comparison: None.

CLINICAL DATA: Abdominal abscess/infection suspected. Umbilical
pain with vomiting this morning.

EXAM:
CT ABDOMEN AND PELVIS WITH CONTRAST
TECHNIQUE: Multidetector CT imaging of the abdomen and pelvis was performed
using the standard protocol following bolus administration of
intravenous contrast.
CONTRAST:  75mL OMNIPAQUE IOHEXOL 300 MG/ML  SOLN

[Series 2: axial st · axial · 0.82mm/px · z∈[-515,-105]mm · 12 of 96 slices shown, 14 images]
[im 7/96  soft-tissue]
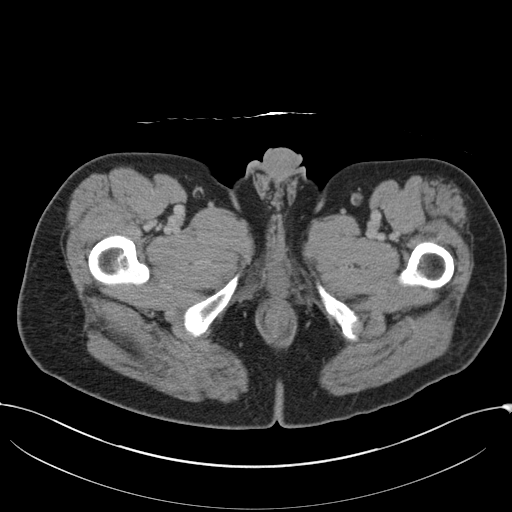
[im 7/96  bone]
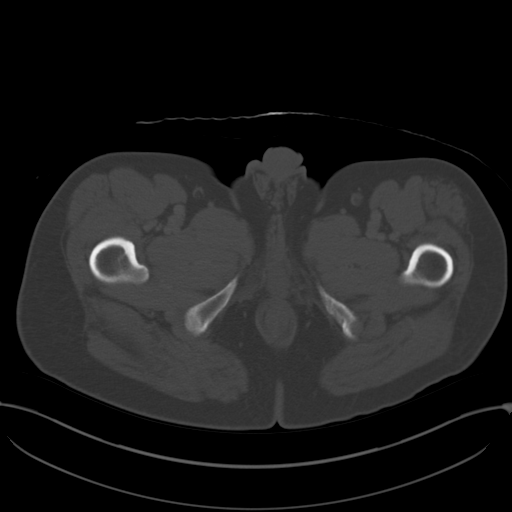
[im 14/96  soft-tissue]
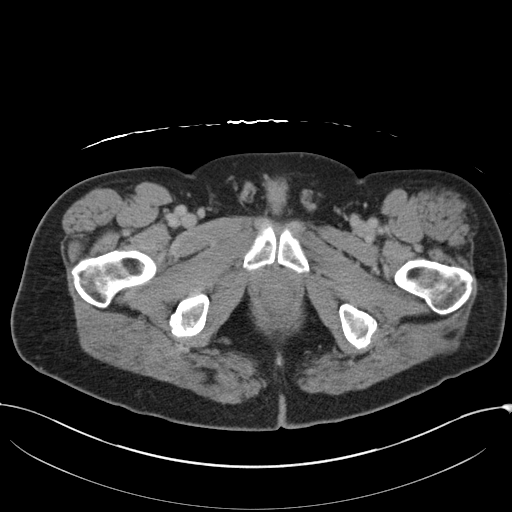
[im 21/96  soft-tissue]
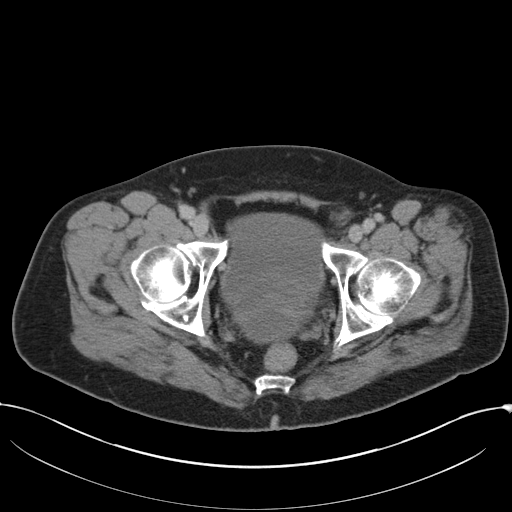
[im 28/96  soft-tissue]
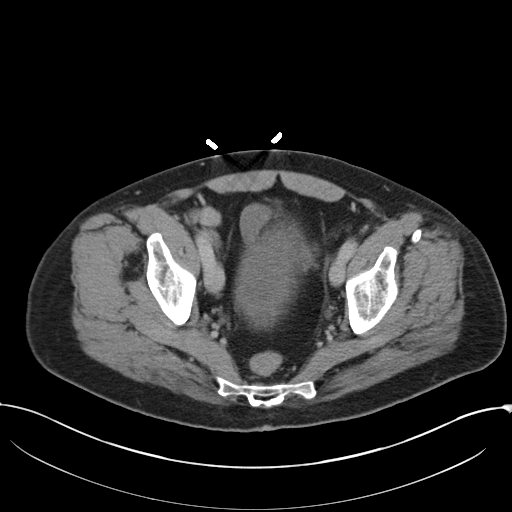
[im 34/96  soft-tissue]
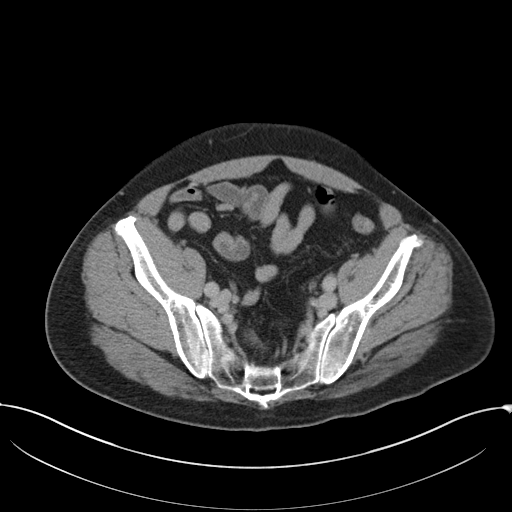
[im 41/96  soft-tissue]
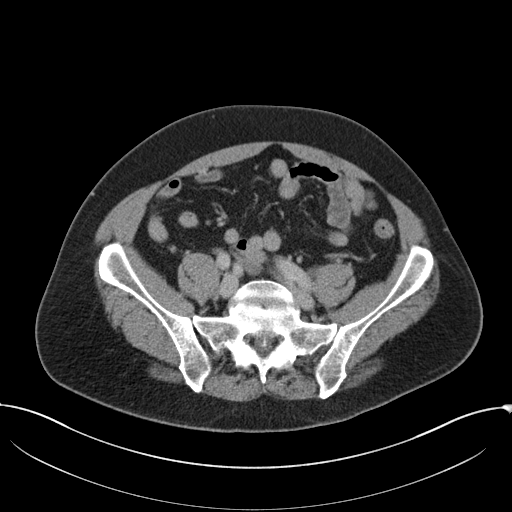
[im 55/96  soft-tissue]
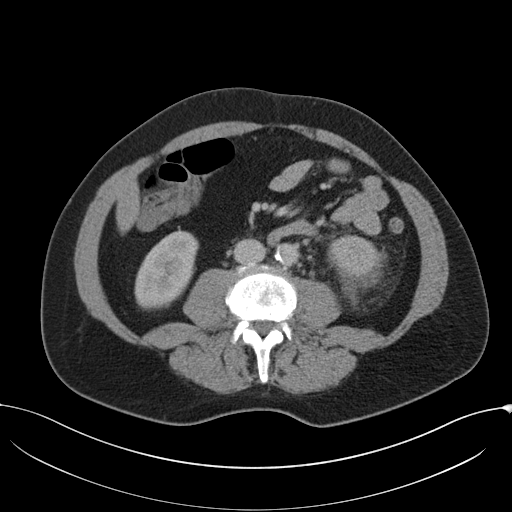
[im 62/96  soft-tissue]
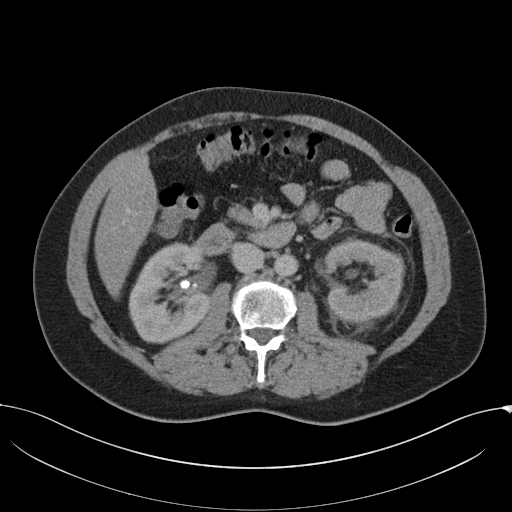
[im 68/96  soft-tissue]
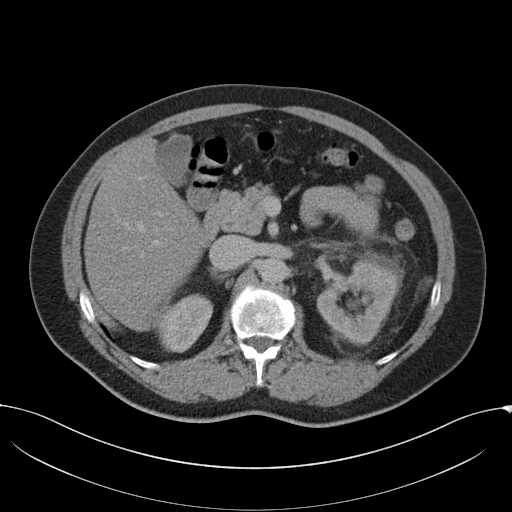
[im 68/96  bone]
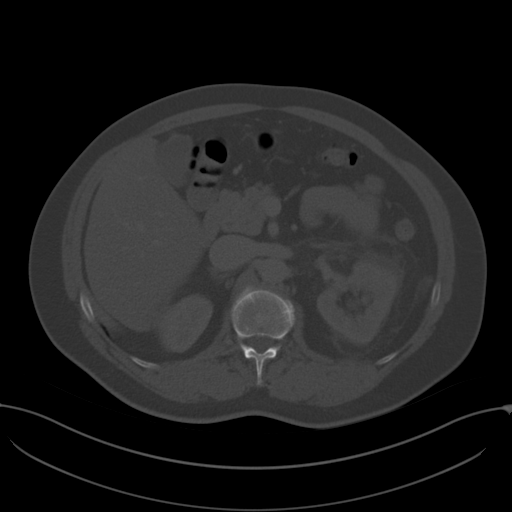
[im 75/96  soft-tissue]
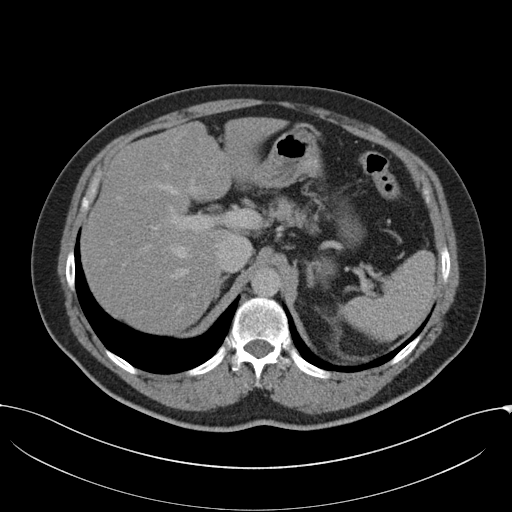
[im 82/96  soft-tissue]
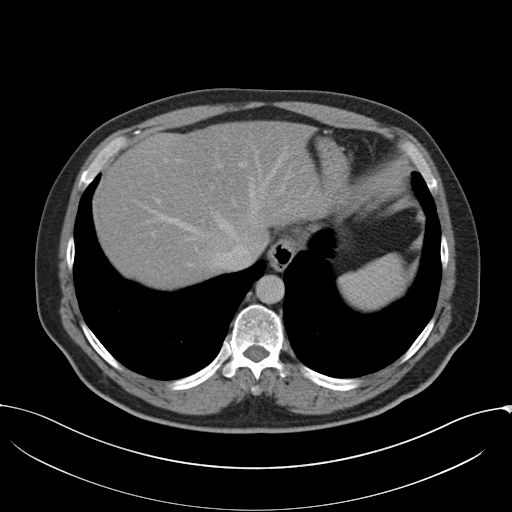
[im 89/96  soft-tissue]
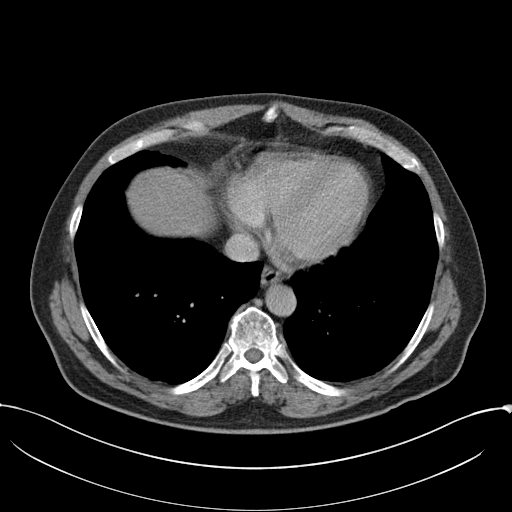

[Series 5: coronal st · coronal · 0.81mm/px · 3 of 152 slices shown]
[im 51/152  soft-tissue]
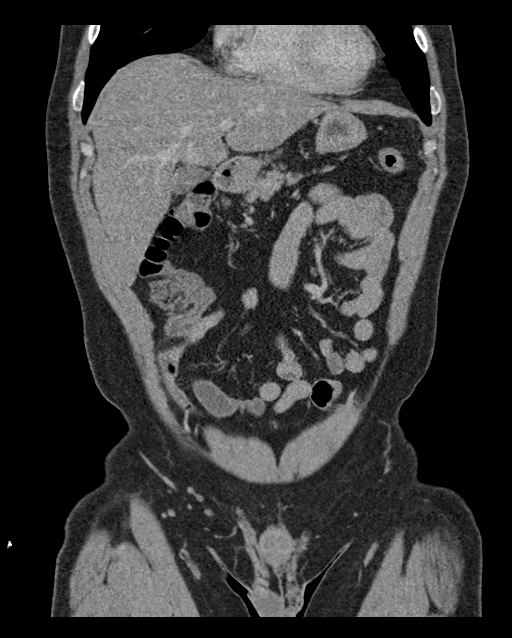
[im 68/152  soft-tissue]
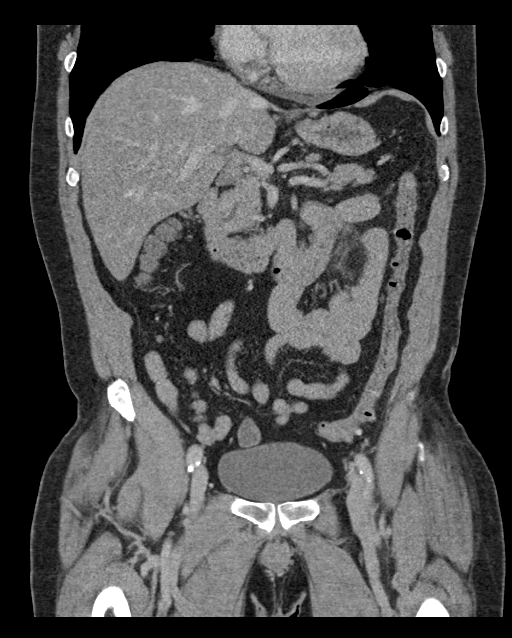
[im 84/152  soft-tissue]
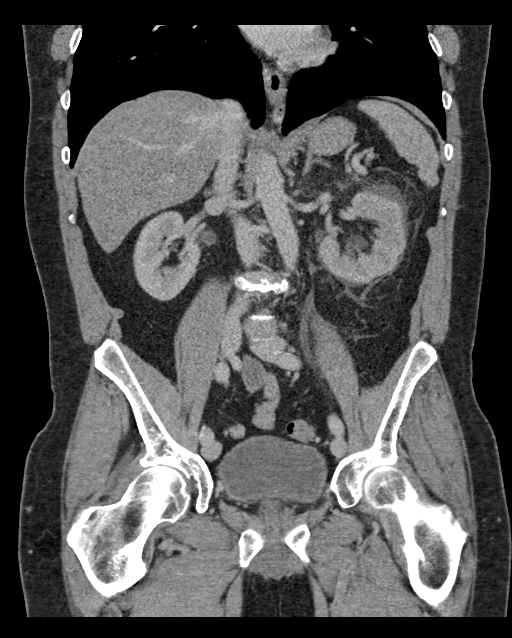

[15 of 46 positions shown; findings below may reference images not displayed]

FINDINGS: Lower chest: No acute abnormality.

Hepatobiliary: No focal liver abnormality is seen. No radiopaque
gallstones, biliary dilatation, or pericholecystic inflammatory
changes.

Pancreas: Unremarkable. No pancreatic ductal dilatation or
surrounding inflammatory changes.

Spleen: Normal in size without focal abnormality.

Adrenals/Urinary Tract: Adrenal glands are normal in appearance.

There is significant stranding surrounding the entire LEFT kidney.
There is delay in the LEFT pyelogram on delayed images. No
intrarenal or ureteral stones. There is a heterogeneous exophytic
enhancing mass extending from the posterior aspect of the midpole
region which measures 2.0 centimeters. No evidence for LEFT renal
vein thrombosis. There is stranding surrounding the entire LEFT
ureter.

Within the renal pelvis of the RIGHT kidney, there are 2 calculi,
measuring 9 and 5 millimeters. Small cyst is identified in the LOWER
pole of the RIGHT kidney. No suspicious renal mass. RIGHT ureter is
unremarkable.

The bladder and visualized portion of the urethra are normal.

Stomach/Bowel: Small sliding-type hiatal hernia. Stomach is
otherwise normal in appearance. Small bionics are normal in caliber
and wall thickness. Numerous colonic diverticula are present, but
there is no evidence for acute diverticulitis. The appendix is well
seen and has a normal appearance.

Vascular/Lymphatic: There is atherosclerotic calcification of the
abdominal aorta, not associated with aneurysm. No retroperitoneal or
mesenteric adenopathy.

Reproductive: Prostate is unremarkable.

Other: No ascites.  Anterior abdominal wall is unremarkable.

Musculoskeletal: There are degenerative changes in the lumbar spine,
notably at L3-4 where there is uncovertebral spurring and 4
millimeters retrolisthesis of L3 on L4. Facet hypertrophy noted in
the LOWER lumbar levels. No lytic or blastic lesions.
IMPRESSION: 1. Acute LEFT pyelonephritis. 2 centimeter heterogeneous exophytic
enhancing mass extending from the posterior aspect of the midpole
region of the LEFT kidney likely represents abscess. As renal cell
carcinoma cannot be entirely excluded, follow-up is recommended.
2. Nonobstructing RIGHT intrarenal calculi.
3. Normal appendix.
4. Colonic diverticulosis without acute diverticulitis.
5. Small sliding-type hiatal hernia.
6. Degenerative changes in the lumbar spine.
7. Aortic Atherosclerosis ([id number]).

## 2022-06-07 DIAGNOSIS — G4700 Insomnia, unspecified: Secondary | ICD-10-CM | POA: Diagnosis not present

## 2022-07-24 DIAGNOSIS — Z1331 Encounter for screening for depression: Secondary | ICD-10-CM | POA: Diagnosis not present

## 2022-07-24 DIAGNOSIS — G4700 Insomnia, unspecified: Secondary | ICD-10-CM | POA: Diagnosis not present

## 2023-05-02 DIAGNOSIS — Z125 Encounter for screening for malignant neoplasm of prostate: Secondary | ICD-10-CM | POA: Diagnosis not present

## 2023-05-02 DIAGNOSIS — M25562 Pain in left knee: Secondary | ICD-10-CM | POA: Diagnosis not present

## 2023-05-02 DIAGNOSIS — Z13220 Encounter for screening for lipoid disorders: Secondary | ICD-10-CM | POA: Diagnosis not present

## 2023-05-02 DIAGNOSIS — G4700 Insomnia, unspecified: Secondary | ICD-10-CM | POA: Diagnosis not present

## 2023-05-02 DIAGNOSIS — Z136 Encounter for screening for cardiovascular disorders: Secondary | ICD-10-CM | POA: Diagnosis not present

## 2023-05-02 DIAGNOSIS — Z1331 Encounter for screening for depression: Secondary | ICD-10-CM | POA: Diagnosis not present

## 2023-05-02 DIAGNOSIS — Z0000 Encounter for general adult medical examination without abnormal findings: Secondary | ICD-10-CM | POA: Diagnosis not present

## 2023-05-02 DIAGNOSIS — I700 Atherosclerosis of aorta: Secondary | ICD-10-CM | POA: Diagnosis not present

## 2023-05-02 DIAGNOSIS — G8929 Other chronic pain: Secondary | ICD-10-CM | POA: Diagnosis not present

## 2023-05-02 DIAGNOSIS — Z131 Encounter for screening for diabetes mellitus: Secondary | ICD-10-CM | POA: Diagnosis not present

## 2023-05-02 DIAGNOSIS — H6123 Impacted cerumen, bilateral: Secondary | ICD-10-CM | POA: Diagnosis not present

## 2023-05-02 DIAGNOSIS — Z1159 Encounter for screening for other viral diseases: Secondary | ICD-10-CM | POA: Diagnosis not present

## 2023-05-02 DIAGNOSIS — R030 Elevated blood-pressure reading, without diagnosis of hypertension: Secondary | ICD-10-CM | POA: Diagnosis not present

## 2023-05-06 ENCOUNTER — Encounter: Payer: Self-pay | Admitting: Family Medicine

## 2023-05-06 ENCOUNTER — Other Ambulatory Visit: Payer: Self-pay | Admitting: Family Medicine

## 2023-05-06 DIAGNOSIS — N2889 Other specified disorders of kidney and ureter: Secondary | ICD-10-CM

## 2024-04-04 ENCOUNTER — Emergency Department (HOSPITAL_COMMUNITY)

## 2024-04-04 ENCOUNTER — Encounter (HOSPITAL_COMMUNITY): Payer: Self-pay

## 2024-04-04 ENCOUNTER — Other Ambulatory Visit: Payer: Self-pay

## 2024-04-04 ENCOUNTER — Emergency Department (HOSPITAL_COMMUNITY)
Admission: EM | Admit: 2024-04-04 | Discharge: 2024-04-05 | Disposition: A | Discharge status: Home / Self Care | Attending: Emergency Medicine | Admitting: Emergency Medicine

## 2024-04-04 DIAGNOSIS — K5730 Diverticulosis of large intestine without perforation or abscess without bleeding: Secondary | ICD-10-CM | POA: Diagnosis not present

## 2024-04-04 DIAGNOSIS — N200 Calculus of kidney: Secondary | ICD-10-CM | POA: Diagnosis not present

## 2024-04-04 DIAGNOSIS — R110 Nausea: Secondary | ICD-10-CM

## 2024-04-04 DIAGNOSIS — R1031 Right lower quadrant pain: Secondary | ICD-10-CM | POA: Diagnosis present

## 2024-04-04 DIAGNOSIS — N132 Hydronephrosis with renal and ureteral calculous obstruction: Secondary | ICD-10-CM | POA: Diagnosis not present

## 2024-04-04 DIAGNOSIS — K449 Diaphragmatic hernia without obstruction or gangrene: Secondary | ICD-10-CM | POA: Diagnosis not present

## 2024-04-04 DIAGNOSIS — N2889 Other specified disorders of kidney and ureter: Secondary | ICD-10-CM | POA: Diagnosis not present

## 2024-04-04 DIAGNOSIS — R112 Nausea with vomiting, unspecified: Secondary | ICD-10-CM | POA: Diagnosis not present

## 2024-04-04 LAB — COMPREHENSIVE METABOLIC PANEL WITH GFR
ALT: 19 U/L (ref 0–44)
AST: 27 U/L (ref 15–41)
Albumin: 4.6 g/dL (ref 3.5–5.0)
Alkaline Phosphatase: 70 U/L (ref 38–126)
Anion gap: 11 (ref 5–15)
BUN: 27 mg/dL — ABNORMAL HIGH (ref 8–23)
CO2: 22 mmol/L (ref 22–32)
Calcium: 9.4 mg/dL (ref 8.9–10.3)
Chloride: 105 mmol/L (ref 98–111)
Creatinine, Ser: 1.38 mg/dL — ABNORMAL HIGH (ref 0.61–1.24)
GFR, Estimated: 55 mL/min — ABNORMAL LOW (ref >60)
Glucose, Bld: 184 mg/dL — ABNORMAL HIGH (ref 70–99)
Potassium: 4 mmol/L (ref 3.5–5.1)
Sodium: 138 mmol/L (ref 135–145)
Total Bilirubin: 0.8 mg/dL (ref 0.0–1.2)
Total Protein: 8 g/dL (ref 6.5–8.1)

## 2024-04-04 LAB — CBC
HCT: 47.4 % (ref 39.0–52.0)
Hemoglobin: 15.5 g/dL (ref 13.0–17.0)
MCH: 30 pg (ref 26.0–34.0)
MCHC: 32.7 g/dL (ref 30.0–36.0)
MCV: 91.9 fL (ref 80.0–100.0)
Platelets: 228 10*3/uL (ref 150–400)
RBC: 5.16 MIL/uL (ref 4.22–5.81)
RDW: 14.7 % (ref 11.5–15.5)
WBC: 15.1 10*3/uL — ABNORMAL HIGH (ref 4.0–10.5)
nRBC: 0 % (ref 0.0–0.2)

## 2024-04-04 LAB — LIPASE, BLOOD: Lipase: 44 U/L (ref 11–51)

## 2024-04-04 MED ORDER — HYDROMORPHONE HCL 1 MG/ML IJ SOLN
1.0000 mg | Freq: Once | INTRAMUSCULAR | Status: AC
  Administered 2024-04-04: 1 mg via INTRAVENOUS
  Filled 2024-04-04: qty 1

## 2024-04-04 MED ORDER — IOHEXOL 300 MG/ML  SOLN
100.0000 mL | Freq: Once | INTRAMUSCULAR | Status: AC | PRN
  Administered 2024-04-04: 100 mL via INTRAVENOUS

## 2024-04-04 MED ORDER — ONDANSETRON HCL 4 MG/2ML IJ SOLN
4.0000 mg | Freq: Once | INTRAMUSCULAR | Status: AC | PRN
  Administered 2024-04-04: 4 mg via INTRAVENOUS
  Filled 2024-04-04: qty 2

## 2024-04-04 MED ORDER — ONDANSETRON HCL 4 MG/2ML IJ SOLN
4.0000 mg | Freq: Once | INTRAMUSCULAR | Status: AC
  Administered 2024-04-04: 4 mg via INTRAVENOUS
  Filled 2024-04-04: qty 2

## 2024-04-04 MED ORDER — SODIUM CHLORIDE 0.9 % IV BOLUS
1000.0000 mL | Freq: Once | INTRAVENOUS | Status: AC
  Administered 2024-04-04: 1000 mL via INTRAVENOUS

## 2024-04-04 MED ORDER — KETOROLAC TROMETHAMINE 15 MG/ML IJ SOLN
15.0000 mg | Freq: Once | INTRAMUSCULAR | Status: AC
  Administered 2024-04-04: 15 mg via INTRAVENOUS
  Filled 2024-04-04: qty 1

## 2024-04-04 NOTE — ED Triage Notes (Signed)
 Pt presents to ED from home, c/o nausea, vomiting, and right flank pain that started today.  Hx of kidney infection.

## 2024-04-04 NOTE — ED Provider Notes (Signed)
  EMERGENCY DEPARTMENT AT [facility] Provider Note   CSN: [id number] Arrival date & time: 04/04/24  2133     History  Chief Complaint  Patient presents with   Flank Pain    right   Nausea   Emesis    Johnny Hawkins is a 68 y.o. male with no documented medical history.  Patient presents to ED for evaluation of right-sided flank pain.  The patient reports that around 6:30 PM tonight had a sudden onset of right-sided flank pain that radiates into his right groin.  Endorsing nausea without vomiting.  Denies any dysuria, inability to urinate.  Denies fevers at home.  Reports history of right-sided kidney infection in the past but denies history of kidney stones.  Denies chest pain, shortness of breath.  Reports last bowel movement was today, normal.   Flank Pain  Emesis      Home Medications Prior to Admission medications   Medication Sig Start Date End Date Taking? Authorizing Provider  HYDROcodone-acetaminophen (NORCO/VICODIN) 5-325 MG tablet Take 1 tablet by mouth every 6 (six) hours as needed for severe pain (pain score 7-10). 04/05/24  Yes Delice Bison F, PA-C  ondansetron (ZOFRAN) 4 MG tablet Take 1 tablet (4 mg total) by mouth every 6 (six) hours. 04/05/24  Yes Al Decant, PA-C  tamsulosin (FLOMAX) 0.4 MG CAPS capsule Take 1 capsule (0.4 mg total) by mouth daily. 04/05/24  Yes Al Decant, PA-C  melatonin 5 MG TABS Take 5 mg by mouth at bedtime as needed (Sleep).   Yes [provider]      Allergies    Patient has no known allergies.    Review of Systems   Review of Systems  Gastrointestinal:  Positive for nausea and vomiting.  Genitourinary:  Positive for flank pain.  All other systems reviewed and are negative.   Physical Exam Updated Vital Signs BP (!) 178/83   Pulse 62   Temp 97.6 F (36.4 C) (Axillary)   Resp 18   Ht 5\' 10"  (1.778 m)   Wt 86.2 kg   SpO2 100%   BMI 27.26 kg/m  Physical Exam Vitals and  nursing note reviewed.  Constitutional:      General: He is not in acute distress.    Appearance: He is well-developed.  HENT:     Head: Normocephalic and atraumatic.  Eyes:     Conjunctiva/sclera: Conjunctivae normal.  Cardiovascular:     Rate and Rhythm: Normal rate and regular rhythm.     Heart sounds: No murmur heard. Pulmonary:     Effort: Pulmonary effort is normal. No respiratory distress.     Breath sounds: Normal breath sounds.  Abdominal:     Palpations: Abdomen is soft.     Tenderness: There is no abdominal tenderness. There is right CVA tenderness.  Musculoskeletal:        General: No swelling.     Cervical back: Neck supple.  Skin:    General: Skin is warm and dry.     Capillary Refill: Capillary refill takes less than 2 seconds.  Neurological:     Mental Status: He is alert.  Psychiatric:        Mood and Affect: Mood normal.     ED Results / Procedures / Treatments   Labs (all labs ordered are listed, but only abnormal results are displayed) Labs Reviewed  COMPREHENSIVE METABOLIC PANEL WITH GFR - Abnormal; Notable for the following components:      Result Value  Glucose, Bld 184 (*)    BUN 27 (*)    Creatinine, Ser 1.38 (*)    GFR, Estimated 55 (*)    All other components within normal limits  CBC - Abnormal; Notable for the following components:   WBC 15.1 (*)    All other components within normal limits  URINALYSIS, ROUTINE W REFLEX MICROSCOPIC - Abnormal; Notable for the following components:   Color, Urine STRAW (*)    Glucose, UA 50 (*)    Hgb urine dipstick LARGE (*)    Ketones, ur 20 (*)    All other components within normal limits  LIPASE, BLOOD    EKG None  Radiology CT ABDOMEN PELVIS W CONTRAST Result Date: 04/04/2024 CLINICAL DATA:  Abdominal pain, acute, nonlocalized. nausea, vomiting, and right flank pain that started today. Hx of kidney infection. EXAM: CT ABDOMEN AND PELVIS WITH CONTRAST TECHNIQUE: Multidetector CT imaging of the  abdomen and pelvis was performed using the standard protocol following bolus administration of intravenous contrast. RADIATION DOSE REDUCTION: This exam was performed according to the departmental dose-optimization program which includes automated exposure control, adjustment of the mA and/or kV according to patient size and/or use of iterative reconstruction technique. CONTRAST:  OMNIPAQUE IOHEXOL 300 MG/ML  SOLN COMPARISON:  CT abdomen pelvis 07/02/2020 FINDINGS: Lower chest: Bilateral subpleural micronodules. Small hiatal hernia. No acute abnormality. Hepatobiliary: The hepatic parenchyma is diffusely hypodense compared to the splenic parenchyma consistent with fatty infiltration. No focal liver abnormality. No gallstones, gallbladder wall thickening, or pericholecystic fluid. No biliary dilatation. Pancreas: No focal lesion. Normal pancreatic contour. No surrounding inflammatory changes. No main pancreatic ductal dilatation. Spleen: Normal in size without focal abnormality. Adrenals/Urinary Tract: No adrenal nodule bilaterally. Asymmetric right perinephric stranding and free fluid. Right nephrolithiasis with a 13 mm stone within the right renal pelvis leading to mild hydronephrosis and a delayed right nephrogram. Stable enhancing and heterogeneous 2.1 x 2.4 cm left renal mass. The urinary bladder is unremarkable. On delayed imaging, there is no urothelial wall thickening and there are no filling defects in the opacified portions of the left collecting system or ureter. No excretion of intravenous contrast from the right kidney on delayed view. Stomach/Bowel: Stomach is within normal limits. No evidence of bowel wall thickening or dilatation. Colonic diverticulosis. Appendix appears normal. Vascular/Lymphatic: No abdominal aorta or iliac aneurysm. Mild atherosclerotic plaque of the aorta and its branches. No abdominal, pelvic, or inguinal lymphadenopathy. Reproductive: Prostate is unremarkable. Other: No  intraperitoneal free fluid. No intraperitoneal free gas. No organized fluid collection. Musculoskeletal: No abdominal wall hernia or abnormality. No suspicious lytic or blastic osseous lesions. No acute displaced fracture. Multilevel degenerative changes of the spine. Mild retrolisthesis of L1 on L2, L2 on L3, L3 on L4. IMPRESSION: 1. Obstructive 1.3 cm right renal pelvis stone. 2. Stable 2.4 cm left renal mass. Finding concerning for malignancy. When the patient is clinically stable and able to follow directions and hold their breath (preferably as an outpatient) further evaluation with dedicated MR renal protocol should be considered. 3. Hepatic steatosis. 4. Colonic diverticulosis with no acute diverticulitis. 5. Small hiatal hernia. Electronically Signed   By: Tish Frederickson M.D.   On: 04/04/2024 23:46    Procedures Procedures   Medications Ordered in ED Medications  tamsulosin (FLOMAX) capsule 0.4 mg (has no administration in time range)  ondansetron (ZOFRAN) injection 4 mg (4 mg Intravenous Given 04/04/24 2205)  ketorolac (TORADOL) 15 MG/ML injection 15 mg (15 mg Intravenous Given 04/04/24 2219)  sodium  chloride 0.9 % bolus 1,000 mL (0 mLs Intravenous Stopped 04/04/24 2319)  ondansetron (ZOFRAN) injection 4 mg (4 mg Intravenous Given 04/04/24 2220)  HYDROmorphone (DILAUDID) injection 1 mg (1 mg Intravenous Given 04/04/24 2252)  iohexol (OMNIPAQUE) 300 MG/ML solution 100 mL (100 mLs Intravenous Contrast Given 04/04/24 2319)  sodium chloride 0.9 % bolus 500 mL (0 mLs Intravenous Stopped 04/05/24 0217)  HYDROmorphone (DILAUDID) injection 0.5 mg (0.5 mg Intravenous Given 04/05/24 0057)    ED Course/ Medical Decision Making/ A&P  Medical Decision Making Amount and/or Complexity of Data Reviewed Labs: ordered. Radiology: ordered.  Risk Prescription drug management.   68 year old male presents for evaluation.  Please see HPI for further details.  On examination the patient is afebrile,  nontachycardic.  Lung sounds are clear bilaterally, not hypoxic.  Abdomen is soft and compressible.  Right-sided CVA tenderness is present.  Neurological examinations at baseline.  Patient overall does appear to be in acute distress.  Suspect nephrolithiasis for the source of the patient pain.  Will assess with CBC, CMP, lipase, urinalysis, CT abdomen pelvis with contrast.  Patient initially given 1 L of fluid, Zofran and Toradol for pain.  Patient has a leukocytosis to 15, baseline hemoglobin.  Creatinine appears to be at baseline at 1.38, GFR 55.  Electrolytes WNL.  BUN 27.  Lipase 44.  Urinalysis shows large hemoglobin, ketones but no nitrites, leukocytes or bacteria.  CT abdomen pelvis shows a 1.3 cm right renal pelvis stone.  Also stable 2.4 cm left renal mass concerning for malignancy.  Patient was given 1.5 mg of Dilaudid which ultimately did control the patient pain.  He was given 1.5 L of fluid.  Patient case was discussed with on-call urology resident Dr. Gordan Payment who advised discharging patient with pain control and having him follow-up in the office today.  Patient pain was controlled on 1.5 mg of Dilaudid.  Patient will be sent home with hydrocodone, Zofran, Flomax.  Patient was advised to call urology office this morning and he voiced understanding.  He had all of his questions answered to his satisfaction.  He is stable to discharge home.   Final Clinical Impression(s) / ED Diagnoses Final diagnoses:  Nephrolithiasis  Nausea    Rx / DC Orders ED Discharge Orders          Ordered    HYDROcodone-acetaminophen (NORCO/VICODIN) 5-325 MG tablet  Every 6 hours PRN        04/05/24 0214    ondansetron (ZOFRAN) 4 MG tablet  Every 6 hours        04/05/24 0214    tamsulosin (FLOMAX) 0.4 MG CAPS capsule  Daily        04/05/24 0214              Al Decant, PA-C 04/05/24 1610    Glyn Ade, MD 04/07/24 [phone]

## 2024-04-04 NOTE — ED Provider Notes (Incomplete)
  [place] EMERGENCY DEPARTMENT AT [facility] Provider Note   CSN: [id number] Arrival date & time: 04/04/24  2133     History  Chief Complaint  Patient presents with  . Flank Pain    right  . Nausea  . Emesis    Johnny Hawkins is a 68 y.o. male with no documented medical history.  Patient presents to ED for evaluation of right-sided flank pain.  The patient reports that around 6:30 PM tonight had a sudden onset of right-sided flank pain that radiates into his right groin.  Endorsing nausea without vomiting.  Denies any dysuria, inability to urinate.  Denies fevers at home.  Reports history of right-sided kidney infection in the past but denies history of kidney stones.  Denies chest pain, shortness of breath.  Reports last bowel movement was today, normal.   Flank Pain  Emesis      Home Medications Prior to Admission medications   Medication Sig Start Date End Date Taking? Authorizing Provider  melatonin 5 MG TABS Take 5 mg by mouth at bedtime.    [provider]      Allergies    Patient has no known allergies.    Review of Systems   Review of Systems  Gastrointestinal:  Positive for vomiting.  Genitourinary:  Positive for flank pain.    Physical Exam Updated Vital Signs BP (!) 183/92 (BP Location: Right Arm)   Pulse 63   Temp 97.6 F (36.4 C) (Axillary)   Resp 20   Ht 5\' 10"  (1.778 m)   Wt 86.2 kg   SpO2 100%   BMI 27.26 kg/m  Physical Exam  ED Results / Procedures / Treatments   Labs (all labs ordered are listed, but only abnormal results are displayed) Labs Reviewed  CBC  LIPASE, BLOOD  COMPREHENSIVE METABOLIC PANEL WITH GFR  URINALYSIS, ROUTINE W REFLEX MICROSCOPIC    EKG None  Radiology No results found.  Procedures Procedures  {Document cardiac monitor, telemetry assessment procedure when appropriate:1}  Medications Ordered in ED Medications  ketorolac (TORADOL) 15 MG/ML injection 15 mg (has no administration in  time range)  sodium chloride 0.9 % bolus 1,000 mL (has no administration in time range)  ondansetron (ZOFRAN) injection 4 mg (has no administration in time range)  ondansetron (ZOFRAN) injection 4 mg (4 mg Intravenous Given 04/04/24 2205)    ED Course/ Medical Decision Making/ A&P   {   Click here for ABCD2, HEART and other calculatorsREFRESH Note before signing :1}                              Medical Decision Making Amount and/or Complexity of Data Reviewed Labs: ordered. Radiology: ordered.  Risk Prescription drug management.   ***  {Document critical care time when appropriate:1} {Document review of labs and clinical decision tools ie heart score, Chads2Vasc2 etc:1}  {Document your independent review of radiology images, and any outside records:1} {Document your discussion with family members, caretakers, and with consultants:1} {Document social determinants of health affecting pt's care:1} {Document your decision making why or why not admission, treatments were needed:1} Final Clinical Impression(s) / ED Diagnoses Final diagnoses:  None    Rx / DC Orders ED Discharge Orders     None

## 2024-04-05 DIAGNOSIS — N200 Calculus of kidney: Secondary | ICD-10-CM | POA: Diagnosis not present

## 2024-04-05 DIAGNOSIS — D49512 Neoplasm of unspecified behavior of left kidney: Secondary | ICD-10-CM | POA: Diagnosis not present

## 2024-04-05 LAB — URINALYSIS, ROUTINE W REFLEX MICROSCOPIC
Bacteria, UA: NONE SEEN
Bilirubin Urine: NEGATIVE
Glucose, UA: 50 mg/dL — AB
Ketones, ur: 20 mg/dL — AB
Leukocytes,Ua: NEGATIVE
Nitrite: NEGATIVE
Protein, ur: NEGATIVE mg/dL
Specific Gravity, Urine: 1.021 (ref 1.005–1.030)
pH: 7 (ref 5.0–8.0)

## 2024-04-05 MED ORDER — HYDROMORPHONE HCL 1 MG/ML IJ SOLN
0.5000 mg | Freq: Once | INTRAMUSCULAR | Status: AC
  Administered 2024-04-05: 0.5 mg via INTRAVENOUS
  Filled 2024-04-05: qty 1

## 2024-04-05 MED ORDER — TAMSULOSIN HCL 0.4 MG PO CAPS: 0.4000 mg | ORAL_CAPSULE | Freq: Every day | ORAL | 0 refills | Status: AC

## 2024-04-05 MED ORDER — SODIUM CHLORIDE 0.9 % IV BOLUS
500.0000 mL | Freq: Once | INTRAVENOUS | Status: AC
  Administered 2024-04-05: 500 mL via INTRAVENOUS

## 2024-04-05 MED ORDER — TAMSULOSIN HCL 0.4 MG PO CAPS
0.4000 mg | ORAL_CAPSULE | ORAL | Status: AC
  Administered 2024-04-05: 0.4 mg via ORAL
  Filled 2024-04-05: qty 1

## 2024-04-05 MED ORDER — HYDROCODONE-ACETAMINOPHEN 5-325 MG PO TABS: 1.0000 | ORAL_TABLET | Freq: Four times a day (QID) | ORAL | 0 refills | Status: AC | PRN

## 2024-04-05 MED ORDER — ONDANSETRON HCL 4 MG PO TABS: 4.0000 mg | ORAL_TABLET | Freq: Four times a day (QID) | ORAL | 0 refills | Status: AC

## 2024-04-05 NOTE — Treatment Plan (Signed)
 Urology treatment plan note:  68 year old who presents with acute onset of nausea, vomiting and right flank pain found to have large renal stone burden.  CT scan with 1.5 cm obstructing UPJ stone on the right side with an additional 1 cm lower pole stone.  In addition to the stone, the left kidney shows an incidental 2.3 x 1.8 cm lower lesion concerning for mass.  Patient currently afebrile and hemodynamically stable.  Labs with a creatinine of 1.38 which appears to be his baseline.  UA negative for infection at this time.  Patient does have a white blood cell count of 15 but has had nausea and vomiting tonight.  No fevers or chills at home.   At this time, recommends close follow-up with urology for treatment of his kidney stones.  In addition patient will need work above left lower pole renal lesion which is concerning for mass with MRI abdomen and pelvis or CT renal.  Please give strict follow-up with irretractable pain, nausea vomiting that is not going away or fever of 100.4 or more.  Jerald Kief, MD, PhD [facility] Resident  [facility] Urology

## 2024-04-05 NOTE — Discharge Instructions (Signed)
 It was a pleasure taking part in your care.  As we discussed, you have a 1.3 cm kidney stone on the right.  This is causing your pain.  Please follow-up with [facility] today.  Call their office at 8 AM.  Please take hydrocodone pain medication every 6 hours as needed for pain.  You may also take Zofran every 6 hours as needed for nausea and vomiting.  Take Flomax once a day to help pass the stone, you received your first dose here today so do not take any additional doses until 4/8.  Read the attached guide concerning kidney stones.  Return to the ED with any new or worsening symptoms.

## 2024-04-06 ENCOUNTER — Ambulatory Visit (HOSPITAL_COMMUNITY): Admitting: Certified Registered"

## 2024-04-06 ENCOUNTER — Other Ambulatory Visit: Payer: Self-pay | Admitting: Urology

## 2024-04-06 ENCOUNTER — Encounter (HOSPITAL_COMMUNITY)
Admission: RE | Disposition: A | Discharge status: Home / Self Care | Payer: Self-pay | Source: Home / Self Care | Attending: Urology

## 2024-04-06 ENCOUNTER — Ambulatory Visit (HOSPITAL_BASED_OUTPATIENT_CLINIC_OR_DEPARTMENT_OTHER): Admitting: Certified Registered"

## 2024-04-06 ENCOUNTER — Ambulatory Visit (HOSPITAL_COMMUNITY)
Admission: RE | Admit: 2024-04-06 | Discharge: 2024-04-06 | Disposition: A | Discharge status: Home / Self Care | Attending: Urology | Admitting: Urology

## 2024-04-06 ENCOUNTER — Encounter (HOSPITAL_COMMUNITY): Payer: Self-pay | Admitting: Urology

## 2024-04-06 ENCOUNTER — Ambulatory Visit: Payer: Self-pay | Admitting: Urology

## 2024-04-06 ENCOUNTER — Ambulatory Visit (HOSPITAL_COMMUNITY)

## 2024-04-06 DIAGNOSIS — N200 Calculus of kidney: Secondary | ICD-10-CM

## 2024-04-06 DIAGNOSIS — N202 Calculus of kidney with calculus of ureter: Secondary | ICD-10-CM | POA: Diagnosis not present

## 2024-04-06 HISTORY — PX: CYSTOSCOPY W/ URETERAL STENT PLACEMENT: SHX1429

## 2024-04-06 SURGERY — CYSTOSCOPY, WITH RETROGRADE PYELOGRAM AND URETERAL STENT INSERTION
Anesthesia: General | Laterality: Right

## 2024-04-06 MED ORDER — SODIUM CHLORIDE 0.9 % IR SOLN
Status: DC | PRN
  Administered 2024-04-06: 3000 mL via INTRAVESICAL

## 2024-04-06 MED ORDER — FENTANYL CITRATE (PF) 100 MCG/2ML IJ SOLN
INTRAMUSCULAR | Status: AC
  Filled 2024-04-06: qty 2

## 2024-04-06 MED ORDER — PHENYLEPHRINE 80 MCG/ML (10ML) SYRINGE FOR IV PUSH (FOR BLOOD PRESSURE SUPPORT)
PREFILLED_SYRINGE | INTRAVENOUS | Status: AC
  Filled 2024-04-06: qty 10

## 2024-04-06 MED ORDER — PROPOFOL 10 MG/ML IV BOLUS
INTRAVENOUS | Status: DC | PRN
  Administered 2024-04-06: 50 mg via INTRAVENOUS
  Administered 2024-04-06: 200 mg via INTRAVENOUS
  Administered 2024-04-06: 50 mg via INTRAVENOUS

## 2024-04-06 MED ORDER — SUCCINYLCHOLINE CHLORIDE 200 MG/10ML IV SOSY
PREFILLED_SYRINGE | INTRAVENOUS | Status: DC | PRN
  Administered 2024-04-06: 200 mg via INTRAVENOUS

## 2024-04-06 MED ORDER — IOHEXOL 300 MG/ML  SOLN
INTRAMUSCULAR | Status: DC | PRN
  Administered 2024-04-06: 8 mL

## 2024-04-06 MED ORDER — LIDOCAINE HCL (CARDIAC) PF 100 MG/5ML IV SOSY
PREFILLED_SYRINGE | INTRAVENOUS | Status: DC | PRN
  Administered 2024-04-06: 60 mg via INTRAVENOUS

## 2024-04-06 MED ORDER — FENTANYL CITRATE (PF) 100 MCG/2ML IJ SOLN
INTRAMUSCULAR | Status: DC | PRN
Start: 2024-04-06 — End: 2024-04-06
  Administered 2024-04-06 (×2): 50 ug via INTRAVENOUS

## 2024-04-06 MED ORDER — ONDANSETRON HCL 4 MG/2ML IJ SOLN
INTRAMUSCULAR | Status: AC
  Filled 2024-04-06: qty 2

## 2024-04-06 MED ORDER — OXYCODONE HCL 5 MG PO TABS: 5.0000 mg | ORAL_TABLET | Freq: Once | ORAL | Status: DC | PRN

## 2024-04-06 MED ORDER — FENTANYL CITRATE PF 50 MCG/ML IJ SOSY
25.0000 ug | PREFILLED_SYRINGE | INTRAMUSCULAR | Status: DC | PRN
Start: 2024-04-06 — End: 2024-04-06

## 2024-04-06 MED ORDER — DEXAMETHASONE SODIUM PHOSPHATE 10 MG/ML IJ SOLN
INTRAMUSCULAR | Status: AC
  Filled 2024-04-06: qty 1

## 2024-04-06 MED ORDER — DEXAMETHASONE SODIUM PHOSPHATE 10 MG/ML IJ SOLN
INTRAMUSCULAR | Status: DC | PRN
  Administered 2024-04-06: 10 mg via INTRAVENOUS

## 2024-04-06 MED ORDER — ONDANSETRON HCL 4 MG/2ML IJ SOLN: 4.0000 mg | Freq: Once | INTRAMUSCULAR | Status: DC | PRN

## 2024-04-06 MED ORDER — ONDANSETRON HCL 4 MG/2ML IJ SOLN
INTRAMUSCULAR | Status: DC | PRN
  Administered 2024-04-06: 4 mg via INTRAVENOUS

## 2024-04-06 MED ORDER — OXYCODONE HCL 5 MG/5ML PO SOLN: 5.0000 mg | Freq: Once | ORAL | Status: DC | PRN

## 2024-04-06 MED ORDER — KETOROLAC TROMETHAMINE 30 MG/ML IJ SOLN
INTRAMUSCULAR | Status: DC | PRN
  Administered 2024-04-06: 30 mg via INTRAVENOUS

## 2024-04-06 MED ORDER — LIDOCAINE HCL (PF) 2 % IJ SOLN
INTRAMUSCULAR | Status: AC
  Filled 2024-04-06: qty 5

## 2024-04-06 MED ORDER — SODIUM CHLORIDE 0.9 % IV SOLN
2.0000 g | INTRAVENOUS | Status: AC
  Administered 2024-04-06: 2 g via INTRAVENOUS
  Filled 2024-04-06: qty 20

## 2024-04-06 MED ORDER — LACTATED RINGERS IV SOLN: INTRAVENOUS | Status: DC | PRN

## 2024-04-06 MED ORDER — CEFAZOLIN SODIUM-DEXTROSE 2-4 GM/100ML-% IV SOLN: 2.0000 g | INTRAVENOUS | Status: DC

## 2024-04-06 MED ORDER — KETOROLAC TROMETHAMINE 30 MG/ML IJ SOLN
INTRAMUSCULAR | Status: AC
  Filled 2024-04-06: qty 1

## 2024-04-06 MED ORDER — PROPOFOL 10 MG/ML IV BOLUS
INTRAVENOUS | Status: AC
  Filled 2024-04-06: qty 20

## 2024-04-06 MED ORDER — PHENYLEPHRINE 80 MCG/ML (10ML) SYRINGE FOR IV PUSH (FOR BLOOD PRESSURE SUPPORT)
PREFILLED_SYRINGE | INTRAVENOUS | Status: DC | PRN
  Administered 2024-04-06: 160 ug via INTRAVENOUS

## 2024-04-06 MED ORDER — MEPERIDINE HCL 50 MG/ML IJ SOLN: 6.2500 mg | INTRAMUSCULAR | Status: DC | PRN

## 2024-04-06 SURGICAL SUPPLY — 21 items
BAG URO CATCHER STRL LF (MISCELLANEOUS) ×1 IMPLANT
BASKET ZERO TIP NITINOL 2.4FR (BASKET) IMPLANT
CATH URETERAL DUAL LUMEN 10F (MISCELLANEOUS) IMPLANT
CATH URETL OPEN END 6FR 70 (CATHETERS) IMPLANT
CLOTH BEACON ORANGE TIMEOUT ST (SAFETY) ×1 IMPLANT
FIBER LASER MOSES 200 DFL (Laser) IMPLANT
FIBER LASER MOSES 365 DFL (Laser) IMPLANT
GLOVE BIO SURGEON STRL SZ8.5 (GLOVE) ×2 IMPLANT
GOWN STRL REUS W/ TWL XL LVL3 (GOWN DISPOSABLE) ×1 IMPLANT
GUIDEWIRE AMPLATZ STIFF 0.35 (WIRE) IMPLANT
GUIDEWIRE STR DUAL SENSOR (WIRE) ×1 IMPLANT
GUIDEWIRE SUPER STIFF (WIRE) IMPLANT
GUIDEWIRE ZIPWRE .038 STRAIGHT (WIRE) IMPLANT
KIT TURNOVER KIT A (KITS) IMPLANT
MANIFOLD NEPTUNE II (INSTRUMENTS) ×1 IMPLANT
PACK CYSTO (CUSTOM PROCEDURE TRAY) ×1 IMPLANT
SHEATH NAVIGATOR HD 11/13X28 (SHEATH) IMPLANT
SHEATH NAVIGATOR HD 11/13X36 (SHEATH) IMPLANT
STENT URET 6FRX28 CONTOUR (STENTS) IMPLANT
TUBING CONNECTING 10 (TUBING) ×1 IMPLANT
TUBING UROLOGY SET (TUBING) ×1 IMPLANT

## 2024-04-06 NOTE — Anesthesia Procedure Notes (Signed)
 Procedure Name: Intubation Date/Time: 04/06/2024 4:11 PM  Performed by: Florene Route, CRNAPre-anesthesia Checklist: Patient identified, Emergency Drugs available, Suction available and Patient being monitored Patient Re-evaluated:Patient Re-evaluated prior to induction Oxygen Delivery Method: Circle system utilized Preoxygenation: Pre-oxygenation with 100% oxygen Induction Type: IV induction Ventilation: Mask ventilation without difficulty Laryngoscope Size: Miller and 3 Grade View: Grade I Tube type: Oral Tube size: 7.5 mm Number of attempts: 1 Airway Equipment and Method: Stylet and Oral airway Placement Confirmation: ETT inserted through vocal cords under direct vision, positive ETCO2 and breath sounds checked- equal and bilateral Secured at: 21 cm Tube secured with: Tape Dental Injury: Teeth and Oropharynx as per pre-operative assessment

## 2024-04-06 NOTE — Op Note (Signed)
 OPERATIVE NOTE   Patient Name: Johnny Hawkins  MRN: [id number]   Date of Procedure: 04/06/24    Preoperative diagnosis:  Right 1.5cm upj stone  Postoperative diagnosis:  Same; stone dislodged into renal pelvis  Procedure:  Cystoscopy, right retrograde, right DJ stent (6x28)- no tether  Attending:  Marcha Solders, MD  Anesthesia: general  Estimated blood loss: minimal  Fluids: Per anesthesia record  Drains: dj stent   Specimens: none  Antibiotics: rocephin  Findings: 1.5cm right upj stone dislodged into renal pelvis  Indications:  Ureteral stone  Description of Procedure:  The patient was brought to the operating room where he was correctly identified and subsequently underwent satisfactory induction of general endotracheal anesthesia in the supine position.  He was then positioned in the modified dorsolithotomy position and his external genitalia were prepped and draped in usual sterile fashion.  Preprocedural timeout was performed.  21 French panendoscope was subsequently inserted per urethra.  Anterior urethra was normal.  Prostatic urethra revealed some lateral lobe enlargement and a slightly elevated bladder neck.  Bladder was subsequently entered and carefully inspected.  There was moderate bladder trabeculation.  Both ureteral or openings were normal.  The right ureter was subsequently cannulated with an open-ended catheter and a retrograde pyelogram was performed with intraoperative interpretation of fluoroscopic images.  There was a normal-appearing ureter up to the level of the stone at the UPJ.  Subsequently a safety wire was placed under endoscopic and fluoroscopic control through the endhole catheter up to the level of the stone and was able to be manipulated beyond the stone into the renal collecting system.  In the process of passing the wire the stone was dislodged and fell back into the renal pelvis.  Subsequently the endhole catheter was removed and over the  remaining safety wire I placed a 6 x 28 cm double-J stent with good position obtained proximally and distally.  The bladder was subsequently drained and the procedure terminated.  The patient tolerated the procedure well.  There were no apparent complications.  He was subsequently extubated and taken to the recovery room in satisfactory condition.  Complications: None  Condition: Stable, extubated, transferred to PACU  Plan: will arrange ESWL

## 2024-04-06 NOTE — H&P (Signed)
 Assessment: Right flank pain 1.5cm right upj obstructing stone Right lower pole stone Left indeterminate renal mass   Plan: Today I had a long discussion with the patient regarding his nephrolithiasis and options for management.  As noted he has had poorly controlled right flank pain and nausea today.  We discussed management options in detail including placement of a ureteral stent today followed by ESWL as well as endoscopic management.  Following our discussion the patient elects to proceed with stent placement followed by ESWL nature procedure reviewed in detail today including potential adverse events and complications.  All questions answered.  Informed consent obtained.  Chief Complaint: kidney stone  History of Present Illness:  Johnny Hawkins is a 68 y.o. male admitted today with severe right flank pain and 1.5cm right upj stone. He presented to ED early yesterday with acute onset of nausea, vomiting and right flank pain found to have large renal stone burden.  CT scan with 1.5 cm obstructing UPJ stone on the right side with an additional 1 cm lower pole stone.  In addition to the stone, the left kidney shows an incidental 2.3 x 1.8 cm lower lesion concerning for mass.   Patient currently afebrile and hemodynamically stable.  Labs with a creatinine of 1.38 which appears to be his baseline.  UA negative for infection at this time.  Patient does have a white blood cell count of 15 but has had nausea and vomiting tonight.  No fevers or chills at home.    Initial plan was close follow-up outpatient with treatment of his kidney stones.  In addition patient will need workup of left lower pole renal lesion which is concerning for mass with MRI abdomen and pelvis or CT renal.  Patient seen yesterday by Dr. Annabell Howells with plans for elective mgmt.  He presented today with severe right flank pain and nausea.     Past Medical History:  No past medical history on file.  Past Surgical History:   Past Surgical History:  Procedure Laterality Date   TONSILLECTOMY      Allergies:  No Known Allergies  Family History:  No family history on file.  Social History:  Social History   Tobacco Use   Smoking status: Never   Smokeless tobacco: Never  Substance Use Topics   Alcohol use: Never   Drug use: Never    Review of symptoms:  Constitutional:  Negative for unexplained weight loss, night sweats, fever, chills ENT:  Negative for nose bleeds, sinus pain, painful swallowing CV:  Negative for chest pain, shortness of breath, exercise intolerance, palpitations, loss of consciousness Resp:  Negative for cough, wheezing, shortness of breath GI:  Negative for nausea, vomiting, diarrhea, bloody stools GU:  Positives noted in HPI; otherwise negative for gross hematuria, dysuria, urinary incontinence Neuro:  Negative for seizures, poor balance, limb weakness, slurred speech Psych:  Negative for lack of energy, depression, anxiety Endocrine:  Negative for polydipsia, polyuria, symptoms of hypoglycemia (dizziness, hunger, sweating) Hematologic:  Negative for anemia, purpura, petechia, prolonged or excessive bleeding, use of anticoagulants  Allergic:  Negative for difficulty breathing or choking as a result of exposure to anything; no shellfish allergy; no allergic response (rash/itch) to materials, foods  Physical exam: GENERAL APPEARANCE:  Well appearing, well developed, well nourished, NAD HEENT: Atraumatic, Normocephalic LUNGS: Clear to auscultation bilaterally. HEART: Regular Rate and Rhythm  ABDOMEN: Soft, non-tender, No Masses. EXTREMITIES: Moves all extremities well.  Without clubbing, cyanosis, or edema. NEUROLOGIC:  Alert and oriented x  3, normal gait, CN II-XII grossly intact.  MENTAL STATUS:  Appropriate. BACK:  Non-tender to palpation.  No CVAT SKIN:  Warm, dry and intact.

## 2024-04-06 NOTE — H&P (View-Only) (Signed)
 Assessment: Right flank pain 1.5cm right upj obstructing stone Right lower pole stone Left indeterminate renal mass   Plan: Today I had a long discussion with the patient regarding his nephrolithiasis and options for management.  As noted he has had poorly controlled right flank pain and nausea today.  We discussed management options in detail including placement of a ureteral stent today followed by ESWL as well as endoscopic management.  Following our discussion the patient elects to proceed with stent placement followed by ESWL nature procedure reviewed in detail today including potential adverse events and complications.  All questions answered.  Informed consent obtained.  Chief Complaint: kidney stone  History of Present Illness:  Johnny Hawkins is a 68 y.o. male admitted today with severe right flank pain and 1.5cm right upj stone. He presented to ED early yesterday with acute onset of nausea, vomiting and right flank pain found to have large renal stone burden.  CT scan with 1.5 cm obstructing UPJ stone on the right side with an additional 1 cm lower pole stone.  In addition to the stone, the left kidney shows an incidental 2.3 x 1.8 cm lower lesion concerning for mass.   Patient currently afebrile and hemodynamically stable.  Labs with a creatinine of 1.38 which appears to be his baseline.  UA negative for infection at this time.  Patient does have a white blood cell count of 15 but has had nausea and vomiting tonight.  No fevers or chills at home.    Initial plan was close follow-up outpatient with treatment of his kidney stones.  In addition patient will need workup of left lower pole renal lesion which is concerning for mass with MRI abdomen and pelvis or CT renal.  Patient seen yesterday by Dr. Annabell Howells with plans for elective mgmt.  He presented today with severe right flank pain and nausea.     Past Medical History:  No past medical history on file.  Past Surgical History:   Past Surgical History:  Procedure Laterality Date   TONSILLECTOMY      Allergies:  No Known Allergies  Family History:  No family history on file.  Social History:  Social History   Tobacco Use   Smoking status: Never   Smokeless tobacco: Never  Substance Use Topics   Alcohol use: Never   Drug use: Never    Review of symptoms:  Constitutional:  Negative for unexplained weight loss, night sweats, fever, chills ENT:  Negative for nose bleeds, sinus pain, painful swallowing CV:  Negative for chest pain, shortness of breath, exercise intolerance, palpitations, loss of consciousness Resp:  Negative for cough, wheezing, shortness of breath GI:  Negative for nausea, vomiting, diarrhea, bloody stools GU:  Positives noted in HPI; otherwise negative for gross hematuria, dysuria, urinary incontinence Neuro:  Negative for seizures, poor balance, limb weakness, slurred speech Psych:  Negative for lack of energy, depression, anxiety Endocrine:  Negative for polydipsia, polyuria, symptoms of hypoglycemia (dizziness, hunger, sweating) Hematologic:  Negative for anemia, purpura, petechia, prolonged or excessive bleeding, use of anticoagulants  Allergic:  Negative for difficulty breathing or choking as a result of exposure to anything; no shellfish allergy; no allergic response (rash/itch) to materials, foods  Physical exam: GENERAL APPEARANCE:  Well appearing, well developed, well nourished, NAD HEENT: Atraumatic, Normocephalic LUNGS: Clear to auscultation bilaterally. HEART: Regular Rate and Rhythm  ABDOMEN: Soft, non-tender, No Masses. EXTREMITIES: Moves all extremities well.  Without clubbing, cyanosis, or edema. NEUROLOGIC:  Alert and oriented x  3, normal gait, CN II-XII grossly intact.  MENTAL STATUS:  Appropriate. BACK:  Non-tender to palpation.  No CVAT SKIN:  Warm, dry and intact.

## 2024-04-06 NOTE — Progress Notes (Signed)
 Anesthesia Review:  PCP: Cardiologist :  PPM/ ICD: Device Orders: Rep Notified:  Chest x-ray : EKG : Echo : Stress test: Cardiac Cath :   Activity level:  Sleep Study/ CPAP : Fasting Blood Sugar :      / Checks Blood Sugar -- times a day:    Blood Thinner/ Instructions /Last Dose: ASA / Instructions/ Last Dose :    Telephone appt on 04/07/2024 at 0800am  In ED 04/04/2024  Ureteroscopy on 04/06/24

## 2024-04-06 NOTE — Transfer of Care (Signed)
 Immediate Anesthesia Transfer of Care Note  Patient: Johnny Hawkins  Procedure(s) Performed: CYSTOSCOPY, WITH RETROGRADE PYELOGRAM AND URETERAL STENT INSERTION (Right)  Patient Location: PACU  Anesthesia Type:General  Level of Consciousness: drowsy  Airway & Oxygen Therapy: Patient Spontanous Breathing and Patient connected to face mask oxygen  Post-op Assessment: Report given to RN and Post -op Vital signs reviewed and stable  Post vital signs: Reviewed and stable  Last Vitals:  Vitals Value Taken Time  BP    Temp    Pulse 76 04/06/24 1642  Resp 12 04/06/24 1642  SpO2 100 % 04/06/24 1642  Vitals shown include unfiled device data.  Last Pain:  Vitals:   04/06/24 1324  TempSrc: Oral  PainSc: 0-No pain         Complications: No notable events documented.

## 2024-04-06 NOTE — Anesthesia Preprocedure Evaluation (Addendum)
 Anesthesia Evaluation  Patient identified by MRN, date of birth, ID band Patient awake    Reviewed: Allergy & Precautions, H&P , NPO status , Patient's Chart, lab work & pertinent test results  Airway Mallampati: I  TM Distance: >3 FB Neck ROM: Full    Dental no notable dental hx. (+) Teeth Intact, Missing, Dental Advisory Given,    Pulmonary neg pulmonary ROS   Pulmonary exam normal breath sounds clear to auscultation       Cardiovascular Exercise Tolerance: Good negative cardio ROS Normal cardiovascular exam Rhythm:Regular Rate:Normal     Neuro/Psych negative neurological ROS  negative psych ROS   GI/Hepatic negative GI ROS, Neg liver ROS,,,  Endo/Other  negative endocrine ROS    Renal/GU Renal diseasenegative Renal ROS  negative genitourinary   Musculoskeletal negative musculoskeletal ROS (+)    Abdominal   Peds negative pediatric ROS (+)  Hematology negative hematology ROS (+)   Anesthesia Other Findings   Reproductive/Obstetrics negative OB ROS                             Anesthesia Physical Anesthesia Plan  ASA: 3  Anesthesia Plan: General   Post-op Pain Management: Minimal or no pain anticipated   Induction: Intravenous  PONV Risk Score and Plan: 2 and Ondansetron, Dexamethasone and Treatment may vary due to age or medical condition  Airway Management Planned: Oral ETT and LMA  Additional Equipment: None  Intra-op Plan:   Post-operative Plan: Extubation in OR  Informed Consent: I have reviewed the patients History and Physical, chart, labs and discussed the procedure including the risks, benefits and alternatives for the proposed anesthesia with the patient or authorized representative who has indicated his/her understanding and acceptance.       Plan Discussed with: Anesthesiologist and CRNA  Anesthesia Plan Comments: (  )        Anesthesia Quick  Evaluation

## 2024-04-07 ENCOUNTER — Telehealth: Payer: Self-pay | Admitting: Urology

## 2024-04-07 ENCOUNTER — Telehealth: Payer: Self-pay

## 2024-04-07 ENCOUNTER — Encounter (HOSPITAL_COMMUNITY): Payer: Self-pay | Admitting: Urology

## 2024-04-07 ENCOUNTER — Other Ambulatory Visit: Payer: Self-pay

## 2024-04-07 ENCOUNTER — Other Ambulatory Visit: Payer: Self-pay | Admitting: Urology

## 2024-04-07 ENCOUNTER — Encounter (HOSPITAL_COMMUNITY)
Admission: RE | Admit: 2024-04-07 | Discharge: 2024-04-07 | Disposition: A | Discharge status: Home / Self Care | Source: Ambulatory Visit

## 2024-04-07 DIAGNOSIS — D49512 Neoplasm of unspecified behavior of left kidney: Secondary | ICD-10-CM

## 2024-04-07 NOTE — Progress Notes (Addendum)
 Anesthesia Review:  PCP: none  Cardiologist : none   PPM/ ICD: Device Orders: Rep Notified:  Chest x-ray : EKG : Echo : Stress test: Cardiac Cath :   Activity level: can do a flight of stairs without difficulty  Sleep Study/ CPAP : none  Fasting Blood Sugar :      / Checks Blood Sugar -- times a day:    Blood Thinner/ Instructions /Last Dose: ASA / Instructions/ Last Dose :    Preop appt for 04/13/24 surgery completed via phone call.  Med hx and preop instructions completed  .  PT with recent procedure on 04/06/2024 with Dr Margo Aye.  Pt in ED  on 04/04/2024.  At time of preop phone call pt states he is having a lot of naujsea and nausea med does not seem to be effective.  Instructed pt to call surgeon and let them know to see if they can prescribe something else.     No orders in at preop .  Called and spoke with Loney Laurence and reqeusted orders.  ORders in epic after preop phone call on 04/07/24.

## 2024-04-07 NOTE — Telephone Encounter (Signed)
-----   Message from [place] B sent at 04/07/2024  9:10 AM EDT ----- Regarding: FW: follow up  ----- Message ----- From: Joline Maxcy, MD Sent: 04/06/2024   8:33 AM EDT To: Kipp Brood Subject: follow up                                      Patient in ED yesterday with large stone and ? Renal mass. Needs appt ASAP-  is going to need surgery for stone and further evaluation of renal lesion. thanks

## 2024-04-07 NOTE — Telephone Encounter (Signed)
 Called 2x and LVM for pt to call back to get scheduled for an appt with [facility] or [facility]t

## 2024-04-07 NOTE — Telephone Encounter (Signed)
 Per Dr. Margo Aye, he and Dr. Annabell Howells discussed patient follow up and have decided that Dr. Annabell Howells will follow up with the patient. Left VM with Rhonda at [facility] with this information as patient does have a stent and should not get lost to follow up.

## 2024-04-07 NOTE — Telephone Encounter (Signed)
-----   Message from Joline Maxcy sent at 04/06/2024  4:40 PM EDT ----- Regarding: follow up Placed stent. Talk to me about setting up ESWL

## 2024-04-07 NOTE — Anesthesia Postprocedure Evaluation (Signed)
 Anesthesia Post Note  Patient: Chon Buhl  Procedure(s) Performed: CYSTOSCOPY, WITH RETROGRADE PYELOGRAM AND URETERAL STENT INSERTION (Right)     Patient location during evaluation: PACU Anesthesia Type: General Level of consciousness: awake and alert Pain management: pain level controlled Vital Signs Assessment: post-procedure vital signs reviewed and stable Respiratory status: spontaneous breathing, nonlabored ventilation, respiratory function stable and patient connected to nasal cannula oxygen Cardiovascular status: blood pressure returned to baseline and stable Postop Assessment: no apparent nausea or vomiting Anesthetic complications: no   No notable events documented.  Last Vitals:  Vitals:   04/06/24 1715 04/06/24 1721  BP: (!) 142/87 (!) 171/87  Pulse: 61 71  Resp: 12 20  Temp: 36.8 C   SpO2: 98% 98%    Last Pain:  Vitals:   04/06/24 1721  TempSrc:   PainSc: 0-No pain                 Leya Paige

## 2024-04-13 ENCOUNTER — Other Ambulatory Visit: Payer: Self-pay

## 2024-04-13 ENCOUNTER — Ambulatory Visit (HOSPITAL_COMMUNITY)

## 2024-04-13 ENCOUNTER — Encounter (HOSPITAL_COMMUNITY): Payer: Self-pay | Admitting: Urology

## 2024-04-13 ENCOUNTER — Encounter (HOSPITAL_COMMUNITY)
Admission: RE | Disposition: A | Discharge status: Home / Self Care | Payer: Self-pay | Source: Home / Self Care | Attending: Urology

## 2024-04-13 ENCOUNTER — Ambulatory Visit (HOSPITAL_COMMUNITY)
Admission: RE | Admit: 2024-04-13 | Discharge: 2024-04-13 | Disposition: A | Discharge status: Home / Self Care | Attending: Urology | Admitting: Urology

## 2024-04-13 ENCOUNTER — Ambulatory Visit (HOSPITAL_BASED_OUTPATIENT_CLINIC_OR_DEPARTMENT_OTHER): Admitting: Anesthesiology

## 2024-04-13 ENCOUNTER — Ambulatory Visit (HOSPITAL_COMMUNITY): Admitting: Anesthesiology

## 2024-04-13 DIAGNOSIS — N202 Calculus of kidney with calculus of ureter: Secondary | ICD-10-CM

## 2024-04-13 DIAGNOSIS — Z01818 Encounter for other preprocedural examination: Secondary | ICD-10-CM

## 2024-04-13 DIAGNOSIS — N201 Calculus of ureter: Secondary | ICD-10-CM | POA: Diagnosis not present

## 2024-04-13 DIAGNOSIS — N200 Calculus of kidney: Secondary | ICD-10-CM | POA: Diagnosis present

## 2024-04-13 HISTORY — PX: CYSTOSCOPY/URETEROSCOPY/HOLMIUM LASER/STENT PLACEMENT: SHX6546

## 2024-04-13 LAB — CBC
HCT: 43.3 % (ref 39.0–52.0)
Hemoglobin: 14 g/dL (ref 13.0–17.0)
MCH: 30.3 pg (ref 26.0–34.0)
MCHC: 32.3 g/dL (ref 30.0–36.0)
MCV: 93.7 fL (ref 80.0–100.0)
Platelets: 238 10*3/uL (ref 150–400)
RBC: 4.62 MIL/uL (ref 4.22–5.81)
RDW: 13.9 % (ref 11.5–15.5)
WBC: 8.1 10*3/uL (ref 4.0–10.5)
nRBC: 0 % (ref 0.0–0.2)

## 2024-04-13 LAB — BASIC METABOLIC PANEL WITH GFR
Anion gap: 8 (ref 5–15)
BUN: 13 mg/dL (ref 8–23)
CO2: 26 mmol/L (ref 22–32)
Calcium: 8.8 mg/dL — ABNORMAL LOW (ref 8.9–10.3)
Chloride: 103 mmol/L (ref 98–111)
Creatinine, Ser: 1.02 mg/dL (ref 0.61–1.24)
GFR, Estimated: >60 mL/min (ref >60)
Glucose, Bld: 130 mg/dL — ABNORMAL HIGH (ref 70–99)
Potassium: 4.8 mmol/L (ref 3.5–5.1)
Sodium: 137 mmol/L (ref 135–145)

## 2024-04-13 SURGERY — CYSTOSCOPY/URETEROSCOPY/HOLMIUM LASER/STENT PLACEMENT
Anesthesia: General | Site: Ureter | Laterality: Right

## 2024-04-13 MED ORDER — LACTATED RINGERS IV SOLN: INTRAVENOUS | Status: DC

## 2024-04-13 MED ORDER — IOHEXOL 300 MG/ML  SOLN
INTRAMUSCULAR | Status: DC | PRN
  Administered 2024-04-13: 10 mL via URETHRAL

## 2024-04-13 MED ORDER — FENTANYL CITRATE (PF) 100 MCG/2ML IJ SOLN
INTRAMUSCULAR | Status: AC
  Filled 2024-04-13: qty 2

## 2024-04-13 MED ORDER — ACETAMINOPHEN 10 MG/ML IV SOLN
INTRAVENOUS | Status: AC
  Filled 2024-04-13: qty 100

## 2024-04-13 MED ORDER — EPHEDRINE SULFATE (PRESSORS) 50 MG/ML IJ SOLN
INTRAMUSCULAR | Status: DC | PRN
  Administered 2024-04-13: 10 mg via INTRAVENOUS

## 2024-04-13 MED ORDER — CEFAZOLIN SODIUM-DEXTROSE 2-4 GM/100ML-% IV SOLN
2.0000 g | INTRAVENOUS | Status: AC
  Administered 2024-04-13: 2 g via INTRAVENOUS
  Filled 2024-04-13: qty 100

## 2024-04-13 MED ORDER — PROPOFOL 10 MG/ML IV BOLUS
INTRAVENOUS | Status: DC | PRN
  Administered 2024-04-13: 150 mg via INTRAVENOUS

## 2024-04-13 MED ORDER — MORPHINE SULFATE (PF) 2 MG/ML IV SOLN: 2.0000 mg | INTRAVENOUS | Status: DC | PRN

## 2024-04-13 MED ORDER — PROPOFOL 10 MG/ML IV BOLUS
INTRAVENOUS | Status: AC
  Filled 2024-04-13: qty 20

## 2024-04-13 MED ORDER — ONDANSETRON HCL 4 MG/2ML IJ SOLN
INTRAMUSCULAR | Status: DC | PRN
  Administered 2024-04-13: 4 mg via INTRAVENOUS

## 2024-04-13 MED ORDER — OXYCODONE HCL 5 MG PO TABS
ORAL_TABLET | ORAL | Status: AC
  Administered 2024-04-13: 10 mg via ORAL
  Filled 2024-04-13: qty 2

## 2024-04-13 MED ORDER — SODIUM CHLORIDE 0.9% FLUSH: 3.0000 mL | Freq: Two times a day (BID) | INTRAVENOUS | Status: DC

## 2024-04-13 MED ORDER — ACETAMINOPHEN 325 MG PO TABS: 650.0000 mg | ORAL_TABLET | ORAL | Status: DC | PRN

## 2024-04-13 MED ORDER — FENTANYL CITRATE (PF) 250 MCG/5ML IJ SOLN
INTRAMUSCULAR | Status: DC | PRN
Start: 2024-04-13 — End: 2024-04-13

## 2024-04-13 MED ORDER — ORAL CARE MOUTH RINSE: 15.0000 mL | Freq: Once | OROMUCOSAL | Status: AC

## 2024-04-13 MED ORDER — FENTANYL CITRATE (PF) 100 MCG/2ML IJ SOLN
INTRAMUSCULAR | Status: DC | PRN
  Administered 2024-04-13 (×2): 50 ug via INTRAVENOUS

## 2024-04-13 MED ORDER — ACETAMINOPHEN 10 MG/ML IV SOLN
INTRAVENOUS | Status: DC | PRN
  Administered 2024-04-13: 1000 mg via INTRAVENOUS

## 2024-04-13 MED ORDER — ACETAMINOPHEN 500 MG PO TABS: 1000.0000 mg | ORAL_TABLET | Freq: Once | ORAL | Status: DC

## 2024-04-13 MED ORDER — LIDOCAINE 2% (20 MG/ML) 5 ML SYRINGE
INTRAMUSCULAR | Status: DC | PRN
  Administered 2024-04-13: 80 mg via INTRAVENOUS

## 2024-04-13 MED ORDER — PHENYLEPHRINE HCL-NACL 20-0.9 MG/250ML-% IV SOLN
INTRAVENOUS | Status: DC | PRN
  Administered 2024-04-13: 30 ug/min via INTRAVENOUS

## 2024-04-13 MED ORDER — SODIUM CHLORIDE 0.9 % IV SOLN: 250.0000 mL | INTRAVENOUS | Status: DC | PRN

## 2024-04-13 MED ORDER — SODIUM CHLORIDE 0.9 % IR SOLN
Status: DC | PRN
  Administered 2024-04-13: 3000 mL

## 2024-04-13 MED ORDER — DEXAMETHASONE SODIUM PHOSPHATE 10 MG/ML IJ SOLN
INTRAMUSCULAR | Status: DC | PRN
  Administered 2024-04-13: 10 mg via INTRAVENOUS

## 2024-04-13 MED ORDER — MIDAZOLAM HCL 5 MG/5ML IJ SOLN
INTRAMUSCULAR | Status: DC | PRN
  Administered 2024-04-13: 2 mg via INTRAVENOUS

## 2024-04-13 MED ORDER — ACETAMINOPHEN 650 MG RE SUPP: 650.0000 mg | RECTAL | Status: DC | PRN

## 2024-04-13 MED ORDER — MIDAZOLAM HCL 2 MG/2ML IJ SOLN
INTRAMUSCULAR | Status: AC
  Filled 2024-04-13: qty 2

## 2024-04-13 MED ORDER — ACETAMINOPHEN 325 MG PO TABS
ORAL_TABLET | ORAL | Status: AC
  Filled 2024-04-13: qty 2

## 2024-04-13 MED ORDER — OXYCODONE HCL 5 MG PO TABS: 5.0000 mg | ORAL_TABLET | ORAL | Status: DC | PRN

## 2024-04-13 MED ORDER — FENTANYL CITRATE PF 50 MCG/ML IJ SOSY: 25.0000 ug | PREFILLED_SYRINGE | INTRAMUSCULAR | Status: DC | PRN

## 2024-04-13 MED ORDER — CHLORHEXIDINE GLUCONATE 0.12 % MT SOLN
15.0000 mL | Freq: Once | OROMUCOSAL | Status: AC
  Administered 2024-04-13: 15 mL via OROMUCOSAL

## 2024-04-13 MED ORDER — SODIUM CHLORIDE 0.9% FLUSH: 3.0000 mL | INTRAVENOUS | Status: DC | PRN

## 2024-04-13 SURGICAL SUPPLY — 23 items
BAG URO CATCHER STRL LF (MISCELLANEOUS) ×1 IMPLANT
BASKET STONE NCOMPASS (UROLOGICAL SUPPLIES) IMPLANT
CATH URETERAL DUAL LUMEN 10F (MISCELLANEOUS) IMPLANT
CATH URETL OPEN 5X70 (CATHETERS) IMPLANT
CLOTH BEACON ORANGE TIMEOUT ST (SAFETY) ×1 IMPLANT
EXTRACTOR STONE NITINOL NGAGE (UROLOGICAL SUPPLIES) IMPLANT
FIBER LASER TRAC TIP (UROLOGICAL SUPPLIES) IMPLANT
GLOVE SURG SS PI 8.0 STRL IVOR (GLOVE) ×1 IMPLANT
GOWN STRL SURGICAL XL XLNG (GOWN DISPOSABLE) ×1 IMPLANT
GUIDEWIRE STR DUAL SENSOR (WIRE) ×1 IMPLANT
KIT TURNOVER KIT A (KITS) IMPLANT
LASER FIB FLEXIVA PULSE ID 365 (Laser) IMPLANT
LASER FIB FLEXIVA PULSE ID 550 (Laser) IMPLANT
LASER FIB FLEXIVA PULSE ID 910 (Laser) IMPLANT
MANIFOLD NEPTUNE II (INSTRUMENTS) ×1 IMPLANT
PACK CYSTO (CUSTOM PROCEDURE TRAY) ×1 IMPLANT
SHEATH NAVIGATOR HD 11/13X36 (SHEATH) IMPLANT
SHEATH NAVIGATOR HD 12/14X36 (SHEATH) IMPLANT
SHEATH NAVIGATOR HD 12/14X46 (SHEATH) IMPLANT
STENT URET 6FRX28 CONTOUR (STENTS) IMPLANT
TRACTIP FLEXIVA PULS ID 200XHI (Laser) IMPLANT
TUBING CONNECTING 10 (TUBING) ×1 IMPLANT
TUBING UROLOGY SET (TUBING) ×1 IMPLANT

## 2024-04-13 NOTE — Anesthesia Preprocedure Evaluation (Addendum)
 Anesthesia Evaluation  Patient identified by MRN, date of birth, ID band Patient awake    Reviewed: Allergy & Precautions, H&P , NPO status , Patient's Chart, lab work & pertinent test results  Airway Mallampati: II  TM Distance: >3 FB Neck ROM: Full    Dental no notable dental hx. (+) Teeth Intact, Dental Advisory Given   Pulmonary neg pulmonary ROS   Pulmonary exam normal breath sounds clear to auscultation       Cardiovascular negative cardio ROS  Rhythm:Regular Rate:Normal     Neuro/Psych negative neurological ROS  negative psych ROS   GI/Hepatic negative GI ROS, Neg liver ROS,,,  Endo/Other  negative endocrine ROS    Renal/GU negative Renal ROS  negative genitourinary   Musculoskeletal   Abdominal   Peds  Hematology negative hematology ROS (+)   Anesthesia Other Findings   Reproductive/Obstetrics negative OB ROS                             Anesthesia Physical Anesthesia Plan  ASA: 1  Anesthesia Plan: General   Post-op Pain Management: Tylenol PO (pre-op)*   Induction: Intravenous  PONV Risk Score and Plan: 3 and Ondansetron, Dexamethasone and Treatment may vary due to age or medical condition  Airway Management Planned: LMA  Additional Equipment:   Intra-op Plan:   Post-operative Plan: Extubation in OR  Informed Consent: I have reviewed the patients History and Physical, chart, labs and discussed the procedure including the risks, benefits and alternatives for the proposed anesthesia with the patient or authorized representative who has indicated his/her understanding and acceptance.     Dental advisory given  Plan Discussed with: CRNA  Anesthesia Plan Comments:        Anesthesia Quick Evaluation

## 2024-04-13 NOTE — Anesthesia Procedure Notes (Signed)
 Procedure Name: LMA Insertion Date/Time: 04/13/2024 2:51 PM  Performed by: Keary Passey, CRNAPre-anesthesia Checklist: Patient identified, Emergency Drugs available, Suction available and Patient being monitored Patient Re-evaluated:Patient Re-evaluated prior to induction Oxygen Delivery Method: Circle system utilized Preoxygenation: Pre-oxygenation with 100% oxygen Induction Type: IV induction LMA: LMA inserted LMA Size: 4.0 Tube type: Oral Number of attempts: 1 Placement Confirmation: positive ETCO2 and breath sounds checked- equal and bilateral Tube secured with: Tape Dental Injury: Teeth and Oropharynx as per pre-operative assessment

## 2024-04-13 NOTE — Anesthesia Postprocedure Evaluation (Signed)
 Anesthesia Post Note  Patient: Johnny Hawkins  Procedure(s) Performed: CYSTOSCOPY/URETEROSCOPY/HOLMIUM LASER/STENT PLACEMENT (Right: Ureter)     Patient location during evaluation: PACU Anesthesia Type: General Level of consciousness: awake and alert Pain management: pain level controlled Vital Signs Assessment: post-procedure vital signs reviewed and stable Respiratory status: spontaneous breathing, nonlabored ventilation and respiratory function stable Cardiovascular status: blood pressure returned to baseline and stable Postop Assessment: no apparent nausea or vomiting Anesthetic complications: no  No notable events documented.  Last Vitals:  Vitals:   04/13/24 1710 04/13/24 1715  BP:  (!) 163/89  Pulse:  65  Resp: 14   Temp:  36.5 C  SpO2:  100%    Last Pain:  Vitals:   04/13/24 1800  TempSrc:   PainSc: 3                  Jameka Ivie,W. EDMOND

## 2024-04-13 NOTE — Discharge Instructions (Addendum)
 I will get set for an Xray later this week to make sure there are no residual fragments that are too big to pass.  If there are large fragments we may need to consider additional therapy.

## 2024-04-13 NOTE — Transfer of Care (Signed)
 Immediate Anesthesia Transfer of Care Note  Patient: Johnny Hawkins  Procedure(s) Performed: CYSTOSCOPY/URETEROSCOPY/HOLMIUM LASER/STENT PLACEMENT (Right: Ureter)  Patient Location: PACU  Anesthesia Type:General  Level of Consciousness: awake, alert , oriented, and patient cooperative  Airway & Oxygen Therapy: Patient Spontanous Breathing and Patient connected to face mask oxygen  Post-op Assessment: Report given to RN and Post -op Vital signs reviewed and stable  Post vital signs: Reviewed and stable  Last Vitals:  Vitals Value Taken Time  BP    Temp    Pulse    Resp    SpO2      Last Pain:  Vitals:   04/13/24 1328  TempSrc:   PainSc: 0-No pain         Complications: No notable events documented.

## 2024-04-13 NOTE — Interval H&P Note (Signed)
 History and Physical Interval Note:  He has done well since his stent placement.   04/13/2024 2:35 PM  Digna Fraise  has presented today for surgery, with the diagnosis of RIGHT RENAL STONES.  The various methods of treatment have been discussed with the patient and family. After consideration of risks, benefits and other options for treatment, the patient has consented to  Procedure(s) with comments: CYSTOSCOPY/URETEROSCOPY/HOLMIUM LASER/STENT PLACEMENT (Right) - RIGHT RETROGRADE PYLEOGRAM as a surgical intervention.  The patient's history has been reviewed, patient examined, no change in status, stable for surgery.  I have reviewed the patient's chart and labs.  Questions were answered to the patient's satisfaction.     Sheikh Leverich

## 2024-04-13 NOTE — Op Note (Signed)
 Procedure: 1.  Cystoscopy with right ureteroscopy with holmium laser application and exchange of double-J stent. 2.  Application of fluoroscopy.  Preop diagnosis: Right UPJ and lower pole renal stones.  Postop diagnosis: Same.  Surgeon: Dr. Bjorn Pippin.  Anesthesia: General.  Specimen: None.  Drains: 6 French by 28 cm right contour double-J stent without tether.  EBL: None.  Complications: None.  Indications: The patient is a 68 year old male originally presented to the ER with severe right flank pain with a 16 mm right UPJ stone 11 mm right lower pole stone.  He was initially managed expectantly but had acute recurrence of his symptoms and underwent placement of a right ureteral stent last week.  He returns now for ureteroscopic management.  His stones were 2000 Hounsfield unit on CT scan so it was felt that ESWL would not be as reliably effective as laser fragmentation.  Procedure: He was taken operating room was given antibiotic.  A general anesthetic was induced.  He was placed in lithotomy position and fitted with PAS hose.  His perineum and genitalia were prepped with Betadine solution he was draped in usual sterile fashion.  Cystoscopy was performed using a 21 [place] scope and 30 degree lens.  Examination with a luminal urethra.  The prostatic urethra short with some lateral lobe enlargement.  Examination of bladder revealed mild trabeculation without mucosal lesions.  The left ureteral orifice was unremarkable.  The right ureteral orifice harbored a stent loop.  The stent was grasped with a grasping forceps and pulled the urethral meatus.  The stent was then removed over a sensor wire which was placed in the kidney under fluoroscopic guidance.  A 46 cm 11/13 French digital access sheath was then advanced over the wire to the proximal ureter and then a corn wire was then removed.  The dual-lumen digital flexible scope was then passed to the kidney.  The stone was readily visualized in  the UPJ and was then fragmented using the 242 m laser fiber with the Moses laser set on the dusting setting with 0.3 J and 63 Hz on the left pedal and 0.8 J and 10 Hz on the right pedal.  The left pedal was the primary treatment modality.  The stone fragmented much more readily than I would have anticipated and was broken up into small fragments with the largest in the 2 to 3 mm range as best I can tell visually.  Once the UPJ fragment had been broken I turned my attention to the lower pole stone which was also treated in a similar fashion and reduced to dust and fragments in the 2 to 3 mm range finally shard of the larger UPJ stone was located in an upper calyx and it was also treated in identical fashion.  He had approximately 45 minutes to 50 minutes of laser application time.  No attempt was made to retrieve stones as the fragments were quite small and there was more than I could have readily removed with the basket.  At this point a sensor wire was replaced through the ureteroscope to the kidney under fluoroscopic and visual guidance and the ureteroscope was removed.  There was a small laser perforation in the proximal ureter that I was aware of but no other ureteral injury was identified.  The cystoscope was then reinserted over the wire and a 6 [place] by 28 cm contour double-J stent without tether was placed the kidney under fluoroscopic guidance.  The wire was removed, leaving a good coil  in the kidney and a good coil in the bladder.  The bladder was then drained and the cystoscope was removed.  He was taken out of lithotomy position, his anesthetic was reversed and he was moved to recovery in stable condition.  There were no complications.  I left a stent without a tether because a plan to get a KUB to ensure that no sizable fragments remained and if there are he may need lithotripsy is secondary therapy.

## 2024-04-14 ENCOUNTER — Encounter (HOSPITAL_COMMUNITY): Payer: Self-pay | Admitting: Urology

## 2024-04-15 DIAGNOSIS — N200 Calculus of kidney: Secondary | ICD-10-CM | POA: Diagnosis not present

## 2024-04-19 DIAGNOSIS — N200 Calculus of kidney: Secondary | ICD-10-CM | POA: Diagnosis not present

## 2024-05-26 DIAGNOSIS — Z125 Encounter for screening for malignant neoplasm of prostate: Secondary | ICD-10-CM | POA: Diagnosis not present

## 2024-05-26 DIAGNOSIS — Z87442 Personal history of urinary calculi: Secondary | ICD-10-CM | POA: Diagnosis not present

## 2024-05-26 DIAGNOSIS — Z0000 Encounter for general adult medical examination without abnormal findings: Secondary | ICD-10-CM | POA: Diagnosis not present

## 2024-05-26 DIAGNOSIS — I700 Atherosclerosis of aorta: Secondary | ICD-10-CM | POA: Diagnosis not present

## 2024-05-26 DIAGNOSIS — M25562 Pain in left knee: Secondary | ICD-10-CM | POA: Diagnosis not present

## 2024-05-26 DIAGNOSIS — Z1331 Encounter for screening for depression: Secondary | ICD-10-CM | POA: Diagnosis not present

## 2024-05-26 DIAGNOSIS — G4700 Insomnia, unspecified: Secondary | ICD-10-CM | POA: Diagnosis not present

## 2024-05-26 DIAGNOSIS — I10 Essential (primary) hypertension: Secondary | ICD-10-CM | POA: Diagnosis not present

## 2024-05-26 DIAGNOSIS — Z23 Encounter for immunization: Secondary | ICD-10-CM | POA: Diagnosis not present

## 2024-05-26 DIAGNOSIS — G8929 Other chronic pain: Secondary | ICD-10-CM | POA: Diagnosis not present

## 2024-05-29 DIAGNOSIS — I10 Essential (primary) hypertension: Secondary | ICD-10-CM | POA: Diagnosis not present

## 2024-06-02 DIAGNOSIS — N132 Hydronephrosis with renal and ureteral calculous obstruction: Secondary | ICD-10-CM | POA: Diagnosis not present

## 2024-06-02 DIAGNOSIS — D49512 Neoplasm of unspecified behavior of left kidney: Secondary | ICD-10-CM | POA: Diagnosis not present

## 2024-06-28 DIAGNOSIS — I10 Essential (primary) hypertension: Secondary | ICD-10-CM | POA: Diagnosis not present

## 2024-07-29 DIAGNOSIS — I10 Essential (primary) hypertension: Secondary | ICD-10-CM | POA: Diagnosis not present

## 2024-08-03 DIAGNOSIS — Z01818 Encounter for other preprocedural examination: Secondary | ICD-10-CM | POA: Diagnosis not present

## 2024-08-03 DIAGNOSIS — Z860100 Personal history of colon polyps, unspecified: Secondary | ICD-10-CM | POA: Diagnosis not present

## 2024-08-29 DIAGNOSIS — I10 Essential (primary) hypertension: Secondary | ICD-10-CM | POA: Diagnosis not present

## 2024-09-01 DIAGNOSIS — K635 Polyp of colon: Secondary | ICD-10-CM | POA: Diagnosis not present

## 2024-09-01 DIAGNOSIS — Z1211 Encounter for screening for malignant neoplasm of colon: Secondary | ICD-10-CM | POA: Diagnosis not present

## 2024-09-01 DIAGNOSIS — Z860100 Personal history of colon polyps, unspecified: Secondary | ICD-10-CM | POA: Diagnosis not present

## 2024-09-10 DIAGNOSIS — K635 Polyp of colon: Secondary | ICD-10-CM | POA: Diagnosis not present

## 2024-09-28 DIAGNOSIS — I10 Essential (primary) hypertension: Secondary | ICD-10-CM | POA: Diagnosis not present

## 2024-10-12 DIAGNOSIS — M25562 Pain in left knee: Secondary | ICD-10-CM | POA: Diagnosis not present

## 2024-10-15 DIAGNOSIS — G4700 Insomnia, unspecified: Secondary | ICD-10-CM | POA: Diagnosis not present

## 2024-10-15 DIAGNOSIS — M25562 Pain in left knee: Secondary | ICD-10-CM | POA: Diagnosis not present

## 2024-10-15 DIAGNOSIS — Z01818 Encounter for other preprocedural examination: Secondary | ICD-10-CM | POA: Diagnosis not present

## 2024-10-15 DIAGNOSIS — I10 Essential (primary) hypertension: Secondary | ICD-10-CM | POA: Diagnosis not present

## 2024-10-29 DIAGNOSIS — I10 Essential (primary) hypertension: Secondary | ICD-10-CM | POA: Diagnosis not present
